# Patient Record
Sex: Male | Born: 1970 | Race: White | Hispanic: No | Marital: Married | Smoking: Never smoker
Health system: Southern US, Community
[De-identification: ages and names within clinical notes are randomized; demographics above are authoritative.]

## PROBLEM LIST (undated history)

## (undated) DIAGNOSIS — E7800 Pure hypercholesterolemia, unspecified: Secondary | ICD-10-CM

## (undated) DIAGNOSIS — E119 Type 2 diabetes mellitus without complications: Secondary | ICD-10-CM

## (undated) DIAGNOSIS — I10 Essential (primary) hypertension: Secondary | ICD-10-CM

---

## 2000-02-07 ENCOUNTER — Emergency Department (HOSPITAL_COMMUNITY): Admission: EM | Admit: 2000-02-07 | Discharge: 2000-02-07 | Payer: Self-pay

## 2001-03-17 ENCOUNTER — Other Ambulatory Visit: Admission: RE | Admit: 2001-03-17 | Discharge: 2001-03-17 | Payer: Self-pay | Admitting: Urology

## 2001-03-17 ENCOUNTER — Encounter (INDEPENDENT_AMBULATORY_CARE_PROVIDER_SITE_OTHER): Payer: Self-pay | Admitting: Specialist

## 2002-01-29 ENCOUNTER — Encounter: Payer: Self-pay | Admitting: Family Medicine

## 2002-01-29 ENCOUNTER — Encounter: Admission: RE | Admit: 2002-01-29 | Discharge: 2002-01-29 | Payer: Self-pay | Admitting: Family Medicine

## 2002-04-04 ENCOUNTER — Encounter: Payer: Self-pay | Admitting: Orthopedic Surgery

## 2002-04-04 ENCOUNTER — Ambulatory Visit (HOSPITAL_COMMUNITY): Admission: RE | Admit: 2002-04-04 | Discharge: 2002-04-04 | Payer: Self-pay | Admitting: Orthopedic Surgery

## 2006-06-12 ENCOUNTER — Emergency Department (HOSPITAL_COMMUNITY): Admission: EM | Admit: 2006-06-12 | Discharge: 2006-06-12 | Payer: Self-pay | Admitting: Emergency Medicine

## 2007-06-08 ENCOUNTER — Emergency Department (HOSPITAL_COMMUNITY): Admission: EM | Admit: 2007-06-08 | Discharge: 2007-06-08 | Payer: Self-pay | Admitting: Emergency Medicine

## 2011-02-26 NOTE — Consult Note (Signed)
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  The patient states that he has initially had most pain in the right shoulder  and workup and evaluation was focused on the right shoulder.  The patient  states he does have rotator cuff symptoms.  He states that, however,  recently he has been developing increasing pain in the left knee and has  pain with weightbearing on the left knee.  Initial injury was approximately  2 weeks ago.  The patient has been on ibuprofen, Skelaxin and Vicodin for  his shoulder symptoms.   PAST MEDICAL HISTORY:  Significant for hypertension.   SOCIAL HISTORY:  Does not use drugs.  Does not drink or smoke.  Works in  [employer].   Vital signs upon admission with temperature 99.4.  This was a maximum 101.6  after 1 hour of admission.  Blood pressure ranged from 167/104 to 134/80.  Pulse 86-106 with respiratory rate 20-22.  O2 saturation 95-100%.   On examination of the patient's left knee there was no effusion.  The  patella is nontender to palpation.  There is no tenderness to palpation over  the knee joint itself.  The patient has a large area of bruising with a  fluctuant fluid mass over the medial aspect of the left knee just superior  to the joint line in an area which measures about 10 x 10 cm of a large  subcutaneous fluid collection.  There  is no cellulitis.  There is bruising  and resolving hematoma but no evidence of cellulitis.  There was one area of  an abrasion in the middle of the fluid collection but no full-thickness skin  defect.   The patient received a tetanus booster upon admission and received  ceftriaxone.  Of note, he has a white cell count of 13.4 with a neutrophil  count of 74%.  The remainder of his labs were within normal limits.  Radiographs of the left knee shows an old ACL reconstruction with screws  proximally and distally.  There is no evidence of any gross effusion within  the knee and no bony abnormalities, no evidence of a fracture.   ASSESSMENT:  Hematoma versus seroma versus deep abscess superior aspect of  the left thigh medial to the joint.   PLAN:  After informed consent and sterile prepping, the patient underwent  local anesthetic with 15  mL of 1% lidocaine plain.  After adequate level of  anesthesia obtain an 18-gauge needle was inserted within the fluid  collection.  I was unable to aspirate any fluid.  Then a skin incision was  made approximately 2 cm in length.  Blunt dissection was carried down to the  fluid collection and a clear seroma was aspirated from this area.  There was  essentially no hemorrhagic fluid and no purulent fluid.  This was wicked  open with Iodoform gauze.  A sterile dressing was applied with 4x4s, Kerlix  and Coban.  Deep cultures were obtained.  These were sent for culture and  sensitivity.  We will go ahead and start him on doxycycline 100 mg p.o.  b.i.d. in the event that this may have an  early MRSA infection.  The patient will follow up in the office in 2 days.  He was also given a refill prescription for his Vicodin.  Discussed if he  had increasing pain or symptoms that he is to follow up immediately and  would then proceed with further irrigation and debridement.      Nadara Mustard, MD  Electronically Signed     MVD/MEDQ  D:  06/12/2006  T:   06/13/2006  Job:  [id number]

## 2011-07-23 LAB — URINALYSIS, ROUTINE W REFLEX MICROSCOPIC
Bilirubin Urine: NEGATIVE
Hgb urine dipstick: NEGATIVE
Ketones, ur: NEGATIVE
Protein, ur: NEGATIVE
Specific Gravity, Urine: 1.025
Urobilinogen, UA: 0.2

## 2011-07-23 LAB — I-STAT 8, (EC8 V) (CONVERTED LAB)
BUN: 14
Bicarbonate: 21
Chloride: 104
Glucose, Bld: 127 — ABNORMAL HIGH
HCT: 50
Hemoglobin: 17
Operator id: 294501
Potassium: 3.8
Sodium: 135
TCO2: 22
pCO2, Ven: 26.8 — ABNORMAL LOW
pH, Ven: 7.502 — ABNORMAL HIGH

## 2011-07-23 LAB — DIFFERENTIAL
Basophils Relative: 0
Lymphs Abs: 0.8
Monocytes Absolute: 1.2 — ABNORMAL HIGH
Monocytes Relative: 7
Neutro Abs: 15.4 — ABNORMAL HIGH
Neutrophils Relative %: 88 — ABNORMAL HIGH

## 2011-07-23 LAB — RAPID STREP SCREEN (MED CTR MEBANE ONLY): Streptococcus, Group A Screen (Direct): NEGATIVE

## 2011-07-23 LAB — CBC
HCT: 46
Hemoglobin: 15.9
MCHC: 34.5
MCV: 85.7
Platelets: 200
RBC: 5.36
RDW: 13.9
WBC: 17.4 — ABNORMAL HIGH

## 2011-07-23 LAB — URINE CULTURE
Colony Count: NO GROWTH
Culture: NO GROWTH

## 2011-07-23 LAB — POCT I-STAT CREATININE
Creatinine, Ser: 1
Operator id: 294501

## 2011-07-23 LAB — CULTURE, BLOOD (ROUTINE X 2)
Culture: NO GROWTH
Culture: NO GROWTH

## 2014-07-16 ENCOUNTER — Other Ambulatory Visit: Payer: Self-pay | Admitting: Gastroenterology

## 2014-07-16 DIAGNOSIS — R079 Chest pain, unspecified: Secondary | ICD-10-CM

## 2014-07-23 ENCOUNTER — Ambulatory Visit
Admission: RE | Admit: 2014-07-23 | Discharge: 2014-07-23 | Disposition: A | Discharge status: Home / Self Care | Payer: 59 | Source: Ambulatory Visit | Attending: Gastroenterology | Admitting: Gastroenterology

## 2014-07-23 DIAGNOSIS — R079 Chest pain, unspecified: Secondary | ICD-10-CM

## 2014-07-26 ENCOUNTER — Other Ambulatory Visit: Payer: Self-pay | Admitting: Family Medicine

## 2014-07-26 DIAGNOSIS — R1013 Epigastric pain: Secondary | ICD-10-CM

## 2016-04-19 ENCOUNTER — Ambulatory Visit: Payer: Self-pay

## 2016-04-19 ENCOUNTER — Other Ambulatory Visit: Payer: Self-pay | Admitting: Occupational Medicine

## 2016-04-19 DIAGNOSIS — M25562 Pain in left knee: Secondary | ICD-10-CM

## 2016-05-20 ENCOUNTER — Other Ambulatory Visit: Payer: Self-pay | Admitting: Sports Medicine

## 2016-05-20 DIAGNOSIS — M25562 Pain in left knee: Secondary | ICD-10-CM

## 2016-05-20 DIAGNOSIS — T1590XA Foreign body on external eye, part unspecified, unspecified eye, initial encounter: Secondary | ICD-10-CM

## 2016-05-24 ENCOUNTER — Ambulatory Visit
Admission: RE | Admit: 2016-05-24 | Discharge: 2016-05-24 | Disposition: A | Discharge status: Home / Self Care | Payer: Worker's Compensation | Source: Ambulatory Visit | Attending: Sports Medicine | Admitting: Sports Medicine

## 2016-05-24 DIAGNOSIS — T1590XA Foreign body on external eye, part unspecified, unspecified eye, initial encounter: Secondary | ICD-10-CM

## 2016-05-25 ENCOUNTER — Ambulatory Visit
Admission: RE | Admit: 2016-05-25 | Discharge: 2016-05-25 | Disposition: A | Discharge status: Home / Self Care | Payer: Worker's Compensation | Source: Ambulatory Visit | Attending: Sports Medicine | Admitting: Sports Medicine

## 2016-05-25 DIAGNOSIS — M25562 Pain in left knee: Secondary | ICD-10-CM

## 2017-05-05 ENCOUNTER — Emergency Department (HOSPITAL_COMMUNITY): Payer: 59

## 2017-05-05 ENCOUNTER — Encounter (HOSPITAL_COMMUNITY): Payer: Self-pay

## 2017-05-05 ENCOUNTER — Emergency Department (HOSPITAL_COMMUNITY)
Admission: EM | Admit: 2017-05-05 | Discharge: 2017-05-05 | Disposition: A | Discharge status: Home / Self Care | Payer: 59 | Attending: Emergency Medicine | Admitting: Emergency Medicine

## 2017-05-05 DIAGNOSIS — R42 Dizziness and giddiness: Secondary | ICD-10-CM | POA: Diagnosis not present

## 2017-05-05 DIAGNOSIS — Z7984 Long term (current) use of oral hypoglycemic drugs: Secondary | ICD-10-CM | POA: Diagnosis not present

## 2017-05-05 DIAGNOSIS — R739 Hyperglycemia, unspecified: Secondary | ICD-10-CM

## 2017-05-05 DIAGNOSIS — R1013 Epigastric pain: Secondary | ICD-10-CM | POA: Diagnosis not present

## 2017-05-05 DIAGNOSIS — E119 Type 2 diabetes mellitus without complications: Secondary | ICD-10-CM | POA: Diagnosis not present

## 2017-05-05 DIAGNOSIS — I10 Essential (primary) hypertension: Secondary | ICD-10-CM | POA: Diagnosis not present

## 2017-05-05 DIAGNOSIS — E860 Dehydration: Secondary | ICD-10-CM | POA: Diagnosis not present

## 2017-05-05 HISTORY — DX: Pure hypercholesterolemia, unspecified: E78.00

## 2017-05-05 HISTORY — DX: Type 2 diabetes mellitus without complications: E11.9

## 2017-05-05 HISTORY — DX: Essential (primary) hypertension: I10

## 2017-05-05 LAB — CBC
HCT: 45.6 % (ref 39.0–52.0)
Hemoglobin: 15.9 g/dL (ref 13.0–17.0)
MCH: 29.4 pg (ref 26.0–34.0)
MCHC: 34.9 g/dL (ref 30.0–36.0)
MCV: 84.3 fL (ref 78.0–100.0)
Platelets: 213 10*3/uL (ref 150–400)
RBC: 5.41 MIL/uL (ref 4.22–5.81)
RDW: 13.4 % (ref 11.5–15.5)
WBC: 6 10*3/uL (ref 4.0–10.5)

## 2017-05-05 LAB — BASIC METABOLIC PANEL
Anion gap: 9 (ref 5–15)
BUN: 11 mg/dL (ref 6–20)
CHLORIDE: 104 mmol/L (ref 101–111)
CO2: 23 mmol/L (ref 22–32)
CREATININE: 0.91 mg/dL (ref 0.61–1.24)
Calcium: 9.2 mg/dL (ref 8.9–10.3)
GFR calc Af Amer: >60 mL/min (ref >60)
GFR calc non Af Amer: >60 mL/min (ref >60)
Glucose, Bld: 269 mg/dL — ABNORMAL HIGH (ref 65–99)
Potassium: 3.9 mmol/L (ref 3.5–5.1)
SODIUM: 136 mmol/L (ref 135–145)

## 2017-05-05 LAB — URINALYSIS, ROUTINE W REFLEX MICROSCOPIC
Bacteria, UA: NONE SEEN
Bilirubin Urine: NEGATIVE
Hgb urine dipstick: NEGATIVE
Ketones, ur: NEGATIVE mg/dL
LEUKOCYTES UA: NEGATIVE
NITRITE: NEGATIVE
PH: 5 (ref 5.0–8.0)
PROTEIN: 30 mg/dL — AB
Specific Gravity, Urine: 1.025 (ref 1.005–1.030)

## 2017-05-05 LAB — LIPASE, BLOOD: LIPASE: 30 U/L (ref 11–51)

## 2017-05-05 LAB — TROPONIN I: Troponin I: <0.03 ng/mL (ref <0.03)

## 2017-05-05 MED ORDER — MECLIZINE HCL 25 MG PO TABS
25.0000 mg | ORAL_TABLET | Freq: Once | ORAL | Status: AC
  Administered 2017-05-05: 25 mg via ORAL
  Filled 2017-05-05: qty 1

## 2017-05-05 MED ORDER — MECLIZINE HCL 25 MG PO TABS: 25.0000 mg | ORAL_TABLET | Freq: Three times a day (TID) | ORAL | 0 refills | Status: DC | PRN

## 2017-05-05 MED ORDER — ONDANSETRON 4 MG PO TBDP: 4.0000 mg | ORAL_TABLET | Freq: Three times a day (TID) | ORAL | 0 refills | Status: DC | PRN

## 2017-05-05 MED ORDER — ONDANSETRON HCL 4 MG/2ML IJ SOLN
4.0000 mg | Freq: Once | INTRAMUSCULAR | Status: AC
  Administered 2017-05-05: 4 mg via INTRAVENOUS
  Filled 2017-05-05: qty 2

## 2017-05-05 MED ORDER — SODIUM CHLORIDE 0.9 % IV BOLUS (SEPSIS)
1000.0000 mL | Freq: Once | INTRAVENOUS | Status: AC
  Administered 2017-05-05: 1000 mL via INTRAVENOUS

## 2017-05-05 MED ORDER — FAMOTIDINE 20 MG PO TABS: 20.0000 mg | ORAL_TABLET | Freq: Two times a day (BID) | ORAL | 0 refills | Status: DC

## 2017-05-05 NOTE — ED Notes (Signed)
Patient transported to X-ray 

## 2017-05-05 NOTE — ED Provider Notes (Signed)
[facility]-EMERGENCY DEPT Provider Note   CSN: [id number] Arrival date & time: 05/05/17  [id number]     History   Chief Complaint Chief Complaint  Patient presents with  . Dizziness    HPI Russell Roach is a 46 y.o. male.  Pt presents to the ED today with dizziness.  He said sx started on Monday, July 23.  He did have a fever on the 21st, but none since.  He did see his pcp whom he saw on the 24th.  He said no meds were given.  He told the nurse he had cp, but denies current cp.  He did not take any of his meds this morning.  He describes the dizziness as worsening when he stands up and turns his head.  He feels unsteady when he walks and said things look like they are leaning to the left.  He did have some epigastric abd pain last night.      Past Medical History:  Diagnosis Date  . Diabetes mellitus without complication (HCC)   . High cholesterol   . Hypertension     There are no active problems to display for this patient.   History reviewed. No pertinent surgical history.     Home Medications    Prior to Admission medications   Medication Sig Start Date End Date Taking? Authorizing Provider  acetaminophen (TYLENOL) 325 MG tablet Take 650 mg by mouth every 6 (six) hours as needed for mild pain.   Yes [provider]  ibuprofen (ADVIL,MOTRIN) 800 MG tablet Take 800 mg by mouth every 8 (eight) hours as needed for moderate pain.   Yes [provider]  metFORMIN (GLUCOPHAGE) 1000 MG tablet Take 1,000 mg by mouth daily with breakfast.   Yes [provider]  olmesartan-hydrochlorothiazide (BENICAR HCT) 40-25 MG tablet Take 1 tablet by mouth daily.   Yes [provider]  famotidine (PEPCID) 20 MG tablet Take 1 tablet (20 mg total) by mouth 2 (two) times daily. 05/05/17   Jacalyn LefevreHaviland, Tremeka Helbling, MD  meclizine (ANTIVERT) 25 MG tablet Take 1 tablet (25 mg total) by mouth 3 (three) times daily as needed for dizziness. 05/05/17   Jacalyn LefevreHaviland, Ramanda Paules, MD    ondansetron (ZOFRAN ODT) 4 MG disintegrating tablet Take 1 tablet (4 mg total) by mouth every 8 (eight) hours as needed. 05/05/17   Jacalyn LefevreHaviland, Haniel Fix, MD    Family History History reviewed. No pertinent family history.  Social History Social History  Substance Use Topics  . Smoking status: Not on file  . Smokeless tobacco: Not on file  . Alcohol use Not on file     Allergies   Simvastatin   Review of Systems Review of Systems  Constitutional: Positive for fever.  Cardiovascular: Positive for chest pain.  Neurological: Positive for dizziness.  All other systems reviewed and are negative.    Physical Exam Updated Vital Signs BP 108/73   Pulse 70   Temp 97.8 F (36.6 C)   Resp (!) 22   SpO2 96%   Physical Exam  Constitutional: He is oriented to person, place, and time. He appears well-developed and well-nourished.  HENT:  Head: Normocephalic and atraumatic.  Right Ear: External ear normal.  Left Ear: External ear normal.  Nose: Nose normal.  Mouth/Throat: Oropharynx is clear and moist.  Eyes: Pupils are equal, round, and reactive to light. Conjunctivae and EOM are normal.  Neck: Normal range of motion. Neck supple.  Cardiovascular: Normal rate, regular rhythm, normal heart sounds and intact  distal pulses.   Pulmonary/Chest: Effort normal and breath sounds normal.  Abdominal: Soft. Bowel sounds are normal.  Musculoskeletal: Normal range of motion.  Neurological: He is alert and oriented to person, place, and time.  Skin: Skin is warm and dry.  Psychiatric: He has a normal mood and affect. His behavior is normal. Judgment and thought content normal.  Nursing note and vitals reviewed.    ED Treatments / Results  Labs (all labs ordered are listed, but only abnormal results are displayed) Labs Reviewed  BASIC METABOLIC PANEL - Abnormal; Notable for the following:       Result Value   Glucose, Bld 269 (*)    All other components within normal limits   URINALYSIS, ROUTINE W REFLEX MICROSCOPIC - Abnormal; Notable for the following:    Color, Urine AMBER (*)    Glucose, UA >=500 (*)    Protein, ur 30 (*)    Squamous Epithelial / LPF 0-5 (*)    All other components within normal limits  CBC  TROPONIN I  LIPASE, BLOOD  TROPONIN I  CBG MONITORING, ED    EKG  EKG Interpretation  Date/Time:  Thursday May 05 2017 06:22:54 EDT Ventricular Rate:  86 PR Interval:  142 QRS Duration: 90 QT Interval:  380 QTC Calculation: 454 R Axis:   6 Text Interpretation:  Normal sinus rhythm Anterior infarct , age undetermined Abnormal ECG No old tracing to compare Confirmed by Dione Booze ([id number]) on 05/05/2017 6:49:16 AM       Radiology Dg Chest 2 View  Result Date: 05/05/2017 CLINICAL DATA:  Mid to lower chest pain for the past 3 days. Some dizziness. EXAM: CHEST  2 VIEW COMPARISON:  Chest x-ray of June 08, 2007. FINDINGS: There is elevation of the right hemidiaphragm similar to that seen previously. There is no alveolar infiltrate or pleural effusion. The heart and pulmonary vascularity are normal. The mediastinum is normal in width. The bony thorax exhibits no acute abnormality. IMPRESSION: There is no active cardiopulmonary disease. Electronically Signed   By: David  [place] M.D.   On: 05/05/2017 07:11   Ct Head Wo Contrast  Result Date: 05/05/2017 CLINICAL DATA:  Complaints of dizziness for 3 days. EXAM: CT HEAD WITHOUT CONTRAST TECHNIQUE: Contiguous axial images were obtained from the base of the skull through the vertex without intravenous contrast. COMPARISON:  None. FINDINGS: Brain: No evidence of acute infarction, hemorrhage, hydrocephalus, extra-axial collection or mass lesion/mass effect. Normal cerebral volume. No white matter disease. Vascular: No hyperdense vessel or unexpected calcification. Skull: Normal. Negative for fracture or focal lesion. Sinuses/Orbits: No acute finding. Other: None. IMPRESSION: Negative exam. Electronically  Signed   By: Elsie Stain M.D.   On: 05/05/2017 08:14    Procedures Procedures (including critical care time)  Medications Ordered in ED Medications  sodium chloride 0.9 % bolus 1,000 mL (0 mLs Intravenous Stopped 05/05/17 1124)  ondansetron (ZOFRAN) injection 4 mg (4 mg Intravenous Given 05/05/17 0815)  meclizine (ANTIVERT) tablet 25 mg (25 mg Oral Given 05/05/17 4696)     Initial Impression / Assessment and Plan / ED Course  I have reviewed the triage vital signs and the nursing notes.  Pertinent labs & imaging results that were available during my care of the patient were reviewed by me and considered in my medical decision making (see chart for details).     Pt is feeling better.  He is able to ambulate.  I went over the home Epley maneuver with him and gave  him written instructions.  He knows to return if worse.  Final Clinical Impressions(s) / ED Diagnoses   Final diagnoses:  Vertigo  Dehydration  Hyperglycemia  Epigastric abdominal pain    New Prescriptions New Prescriptions   FAMOTIDINE (PEPCID) 20 MG TABLET    Take 1 tablet (20 mg total) by mouth 2 (two) times daily.   MECLIZINE (ANTIVERT) 25 MG TABLET    Take 1 tablet (25 mg total) by mouth 3 (three) times daily as needed for dizziness.   ONDANSETRON (ZOFRAN ODT) 4 MG DISINTEGRATING TABLET    Take 1 tablet (4 mg total) by mouth every 8 (eight) hours as needed.     Jacalyn LefevreHaviland, Dominyck Reser, MD 05/05/17 1126

## 2017-05-05 NOTE — ED Notes (Signed)
Pt returned to room from CT

## 2017-05-05 NOTE — ED Notes (Signed)
IV removed.

## 2017-05-05 NOTE — ED Notes (Signed)
Ambulated pt in hallway, tolerated well no complaints of dizziness.

## 2017-05-05 NOTE — ED Triage Notes (Signed)
Dizziness and substernal chest pain onset Monday, fever 101.5 weekend. Was seen at md on Tuesday and had labs drawn.

## 2017-05-05 NOTE — ED Notes (Signed)
Pt transported to CT ?

## 2017-08-10 IMAGING — CT CT HEAD W/O CM
3 of 4 series · 15 of 47 positions shown, 18 images · non-contrast
Comparison: None.

CLINICAL DATA: Complaints of dizziness for 3 days.

EXAM:
CT HEAD WITHOUT CONTRAST
TECHNIQUE: Contiguous axial images were obtained from the base of the skull
through the vertex without intravenous contrast.

[Series 4: head 2.0 h70h · axial · 0.46mm/px · z∈[-170,-42]mm · 9 of 82 slices shown, 12 images]
[im 9/82  brain]
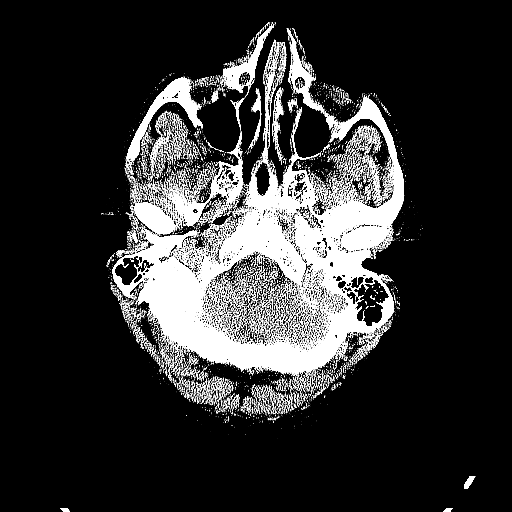
[im 9/82  bone]
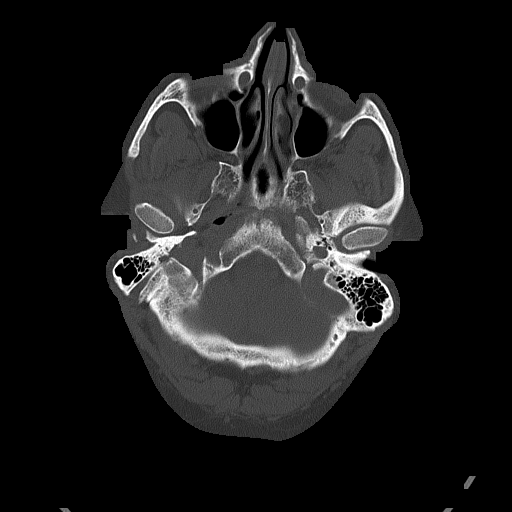
[im 17/82  brain]
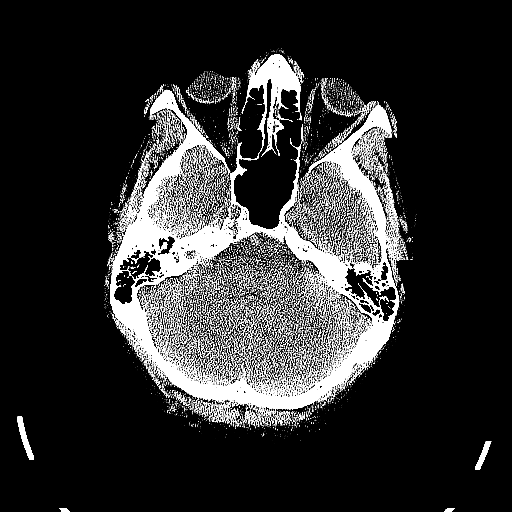
[im 25/82  brain]
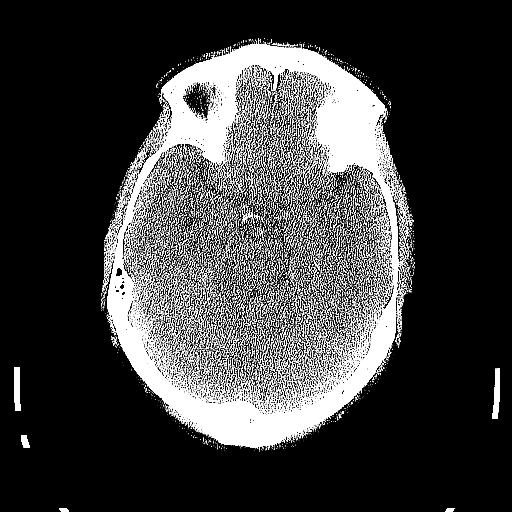
[im 33/82  brain]
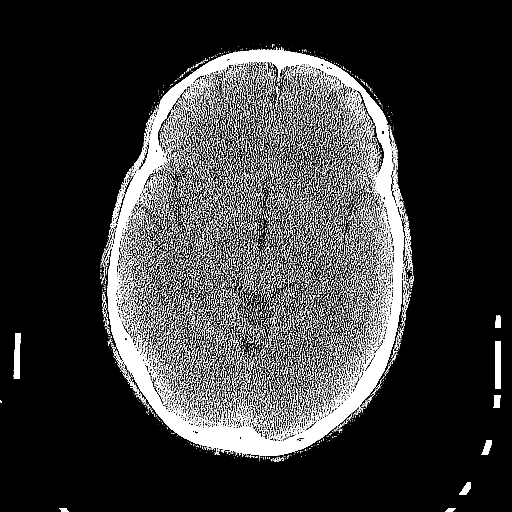
[im 41/82  brain]
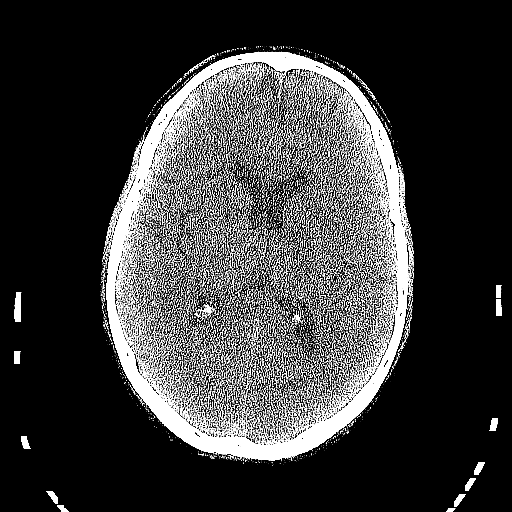
[im 41/82  bone]
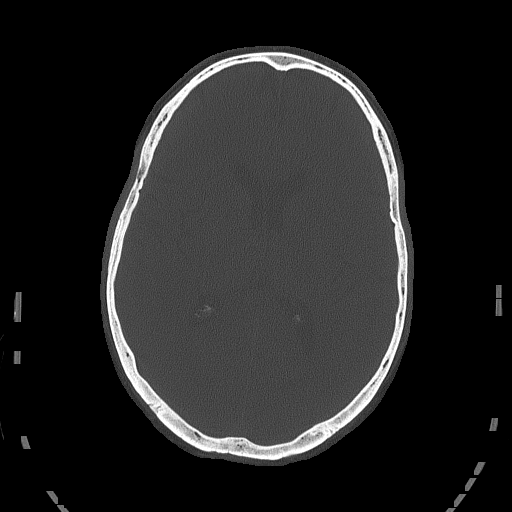
[im 49/82  brain]
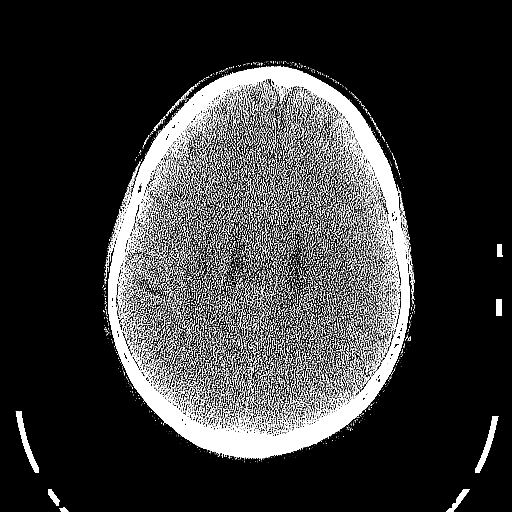
[im 57/82  brain]
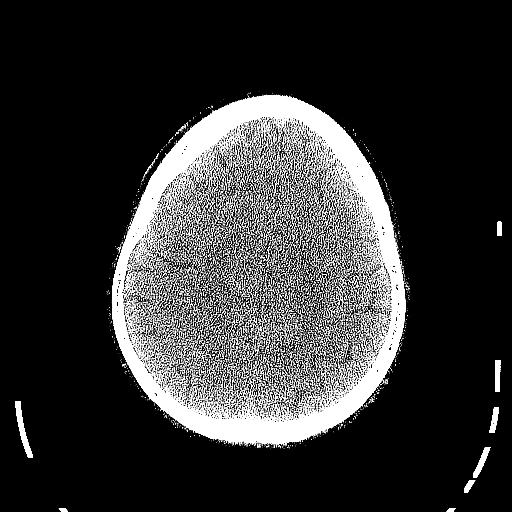
[im 65/82  brain]
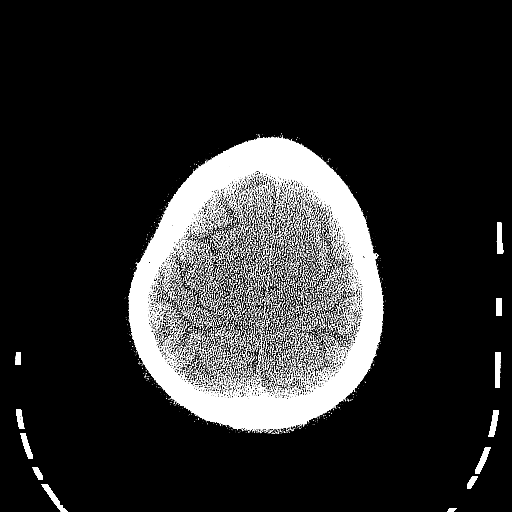
[im 73/82  brain]
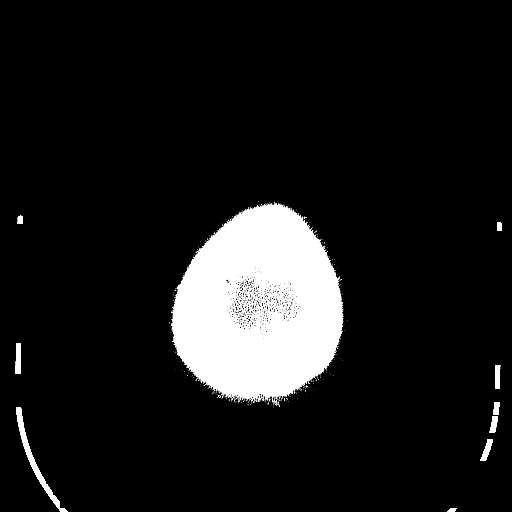
[im 73/82  bone]
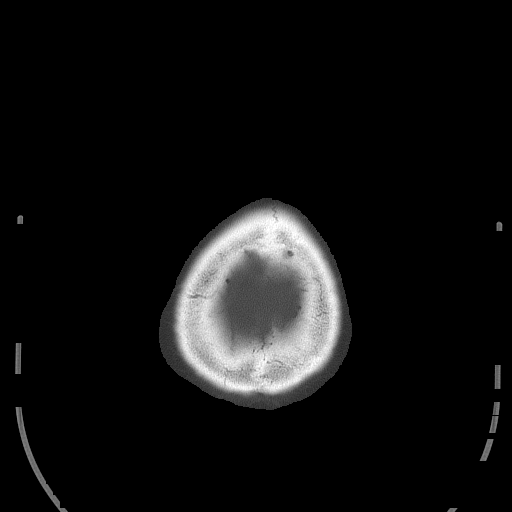

[Series 5: head 3.0 mpr cor · coronal · 0.32mm/px · 3 of 75 slices shown]
[im 29/75  brain]
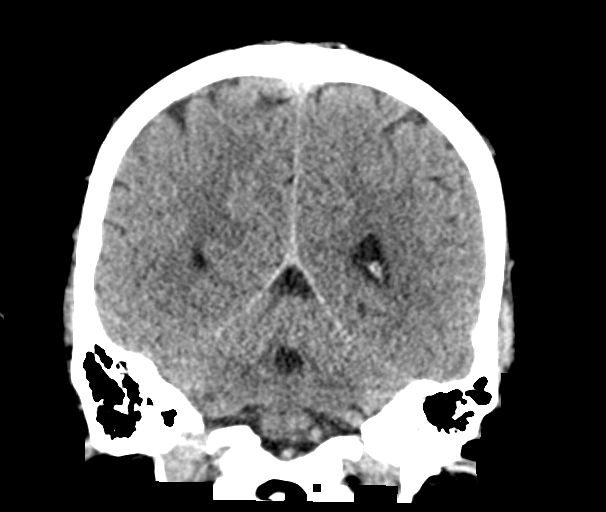
[im 35/75  brain]
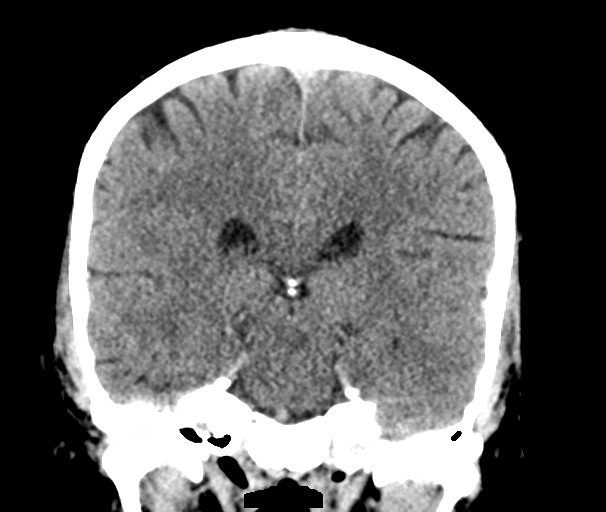
[im 41/75  brain]
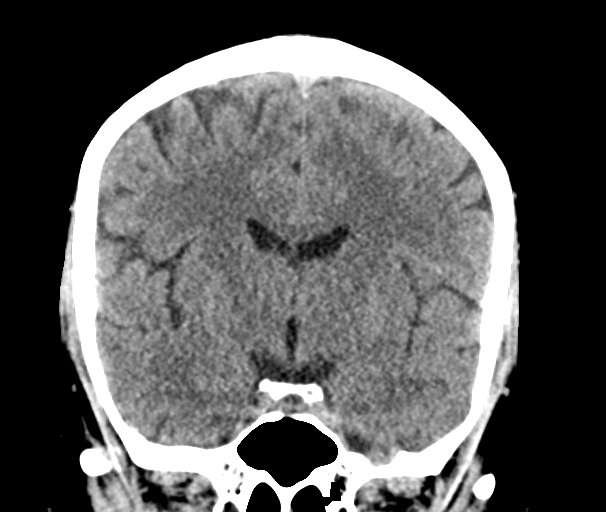

[Series 6: head 3.0 mpr sag · sagittal · 0.33mm/px · 3 of 66 slices shown]
[im 22/66  brain]
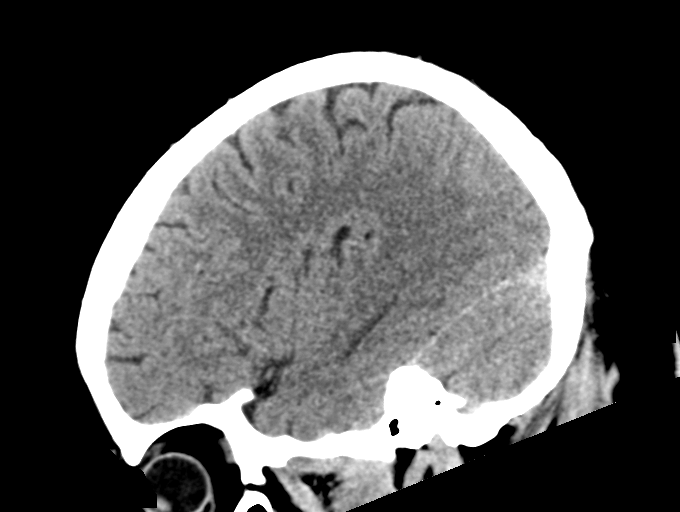
[im 33/66  brain]
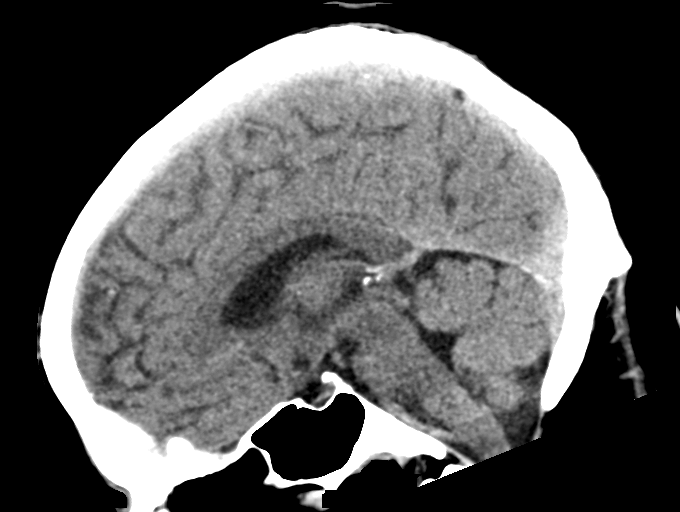
[im 44/66  brain]
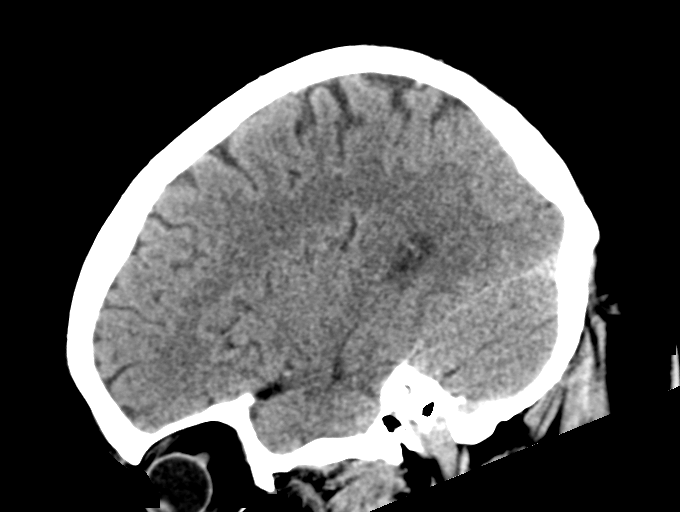

[15 of 47 positions shown; findings below may reference images not displayed]

FINDINGS: Brain: No evidence of acute infarction, hemorrhage, hydrocephalus,
extra-axial collection or mass lesion/mass effect. Normal cerebral
volume. No white matter disease.

Vascular: No hyperdense vessel or unexpected calcification.

Skull: Normal. Negative for fracture or focal lesion.

Sinuses/Orbits: No acute finding.

Other: None.
IMPRESSION: Negative exam.

## 2020-05-17 ENCOUNTER — Other Ambulatory Visit: Payer: Self-pay

## 2020-05-17 ENCOUNTER — Encounter (HOSPITAL_COMMUNITY): Payer: Self-pay | Admitting: Emergency Medicine

## 2020-05-17 ENCOUNTER — Emergency Department (HOSPITAL_COMMUNITY)
Admission: EM | Admit: 2020-05-17 | Discharge: 2020-05-17 | Disposition: A | Discharge status: Home / Self Care | Payer: 59 | Attending: Emergency Medicine | Admitting: Emergency Medicine

## 2020-05-17 DIAGNOSIS — W228XXA Striking against or struck by other objects, initial encounter: Secondary | ICD-10-CM | POA: Insufficient documentation

## 2020-05-17 DIAGNOSIS — Y9389 Activity, other specified: Secondary | ICD-10-CM | POA: Insufficient documentation

## 2020-05-17 DIAGNOSIS — E119 Type 2 diabetes mellitus without complications: Secondary | ICD-10-CM | POA: Insufficient documentation

## 2020-05-17 DIAGNOSIS — Y999 Unspecified external cause status: Secondary | ICD-10-CM | POA: Insufficient documentation

## 2020-05-17 DIAGNOSIS — I10 Essential (primary) hypertension: Secondary | ICD-10-CM | POA: Insufficient documentation

## 2020-05-17 DIAGNOSIS — Y9289 Other specified places as the place of occurrence of the external cause: Secondary | ICD-10-CM | POA: Insufficient documentation

## 2020-05-17 DIAGNOSIS — S0101XA Laceration without foreign body of scalp, initial encounter: Secondary | ICD-10-CM | POA: Insufficient documentation

## 2020-05-17 NOTE — Discharge Instructions (Signed)
Keep the wound clean and dry. There could be slight bleeding - if that occurs, apply firm, constant pressure for 10 minutes.  Please return to the ER if the headache gets severe and in not improving, you have associated new one sided numbness, tingling, weakness or confusion, seizures, poor balance or poor vision.

## 2020-05-17 NOTE — ED Triage Notes (Signed)
Patient here from home reporting head injury after standing up under metal garage door. Head lac noted to left top. Bleeding controlled. No blood thinners.

## 2020-05-17 NOTE — ED Provider Notes (Signed)
[facility]-EMERGENCY DEPT Provider Note   CSN: [id number] Arrival date & time: 05/17/20  1409     History Chief Complaint  Patient presents with  . Head Injury  . Head Laceration    Russell Roach is a 49 y.o. male.  HPI    49 y/o males with diabetes history comes in with cc of scalp bleeding. Pt reports that prior to ED arrival he struck his scalp on to carport. Pt started bleeding. Not on any blood thinners.  Pt has no associated nausea, vomiting, seizures, loss of consciousness or new visual complains, weakness, numbness, dizziness or gait instability.   Past Medical History:  Diagnosis Date  . Diabetes mellitus without complication (HCC)   . High cholesterol   . Hypertension     There are no problems to display for this patient.   History reviewed. No pertinent surgical history.     No family history on file.  Social History   Tobacco Use  . Smoking status: Never Smoker  . Smokeless tobacco: Never Used  Substance Use Topics  . Alcohol use: Never  . Drug use: Never    Home Medications Prior to Admission medications   Medication Sig Start Date End Date Taking? Authorizing Provider  acetaminophen (TYLENOL) 325 MG tablet Take 650 mg by mouth every 6 (six) hours as needed for mild pain.    [provider]  famotidine (PEPCID) 20 MG tablet Take 1 tablet (20 mg total) by mouth 2 (two) times daily. 05/05/17   Jacalyn Lefevre, MD  ibuprofen (ADVIL,MOTRIN) 800 MG tablet Take 800 mg by mouth every 8 (eight) hours as needed for moderate pain.    [provider]  meclizine (ANTIVERT) 25 MG tablet Take 1 tablet (25 mg total) by mouth 3 (three) times daily as needed for dizziness. 05/05/17   Jacalyn Lefevre, MD  metFORMIN (GLUCOPHAGE) 1000 MG tablet Take 1,000 mg by mouth daily with breakfast.    [provider]  olmesartan-hydrochlorothiazide (BENICAR HCT) 40-25 MG tablet Take 1 tablet by mouth daily.    [provider]  ondansetron (ZOFRAN ODT) 4 MG disintegrating tablet Take 1 tablet (4 mg total) by mouth every 8 (eight) hours as needed. 05/05/17   Jacalyn Lefevre, MD    Allergies    Simvastatin  Review of Systems   Review of Systems  Constitutional: Positive for activity change.  Hematological: Bruises/bleeds easily.    Physical Exam Updated Vital Signs BP (!) 152/107   Pulse 91   Temp 98.2 F (36.8 C) (Oral)   Resp 18   SpO2 99%   Physical Exam Vitals and nursing note reviewed.  Cardiovascular:     Rate and Rhythm: Normal rate.  Pulmonary:     Effort: Pulmonary effort is normal.  Musculoskeletal:     Comments: 6 cm laceration to the vertex  Neurological:     Mental Status: He is alert.     Cranial Nerves: No cranial nerve deficit.     ED Results / Procedures / Treatments   Labs (all labs ordered are listed, but only abnormal results are displayed) Labs Reviewed - No data to display  EKG None  Radiology No results found.  Procedures .Marland KitchenLaceration Repair  Date/Time: 05/17/2020 4:32 PM Performed by: Derwood Kaplan, MD Authorized by: Derwood Kaplan, MD   Consent:    Consent obtained:  Verbal   Consent given by:  Patient   Risks discussed:  Infection and pain   Alternatives discussed:  No  treatment Universal protocol:    Procedure explained and questions answered to patient or proxy's satisfaction: yes     Patient identity confirmed:  Arm band Laceration details:    Location:  Scalp   Scalp location:  Crown   Length (cm):  6   Depth (mm):  5 Repair type:    Repair type:  Intermediate Pre-procedure details:    Preparation:  Patient was prepped and draped in usual sterile fashion Exploration:    Wound exploration: wound explored through full range of motion     Contaminated: no   Treatment:    Area cleansed with:  Saline   Amount of cleaning:  Extensive   Irrigation solution:  Sterile saline   Irrigation volume:  20 Skin repair:    Repair  method:  Staples   Number of staples:  5 Approximation:    Approximation:  Loose Post-procedure details:    Dressing:  Open (no dressing)   Patient tolerance of procedure:  Tolerated well, no immediate complications   (including critical care time)  Medications Ordered in ED Medications - No data to display  ED Course  I have reviewed the triage vital signs and the nursing notes.  Pertinent labs & imaging results that were available during my care of the patient were reviewed by me and considered in my medical decision making (see chart for details).    MDM Rules/Calculators/A&P                          Pt comes in with cc of head trauma. Has a laceration that was repaired with 5 staples. No red flags for ICH - clearing brain clinically. Last tetanus was 2 years ago.  Final Clinical Impression(s) / ED Diagnoses Final diagnoses:  Laceration of scalp, initial encounter    Rx / DC Orders ED Discharge Orders    None       Derwood Kaplan, MD 05/17/20 [phone]

## 2021-02-10 ENCOUNTER — Observation Stay (HOSPITAL_COMMUNITY)
Admission: EM | Admit: 2021-02-10 | Discharge: 2021-02-11 | Disposition: A | Discharge status: Home / Self Care | Payer: Managed Care, Other (non HMO) | Attending: Internal Medicine | Admitting: Internal Medicine

## 2021-02-10 ENCOUNTER — Emergency Department (HOSPITAL_COMMUNITY): Payer: Managed Care, Other (non HMO)

## 2021-02-10 ENCOUNTER — Other Ambulatory Visit: Payer: Self-pay

## 2021-02-10 ENCOUNTER — Encounter (HOSPITAL_COMMUNITY): Payer: Self-pay | Admitting: Internal Medicine

## 2021-02-10 DIAGNOSIS — N179 Acute kidney failure, unspecified: Secondary | ICD-10-CM | POA: Diagnosis not present

## 2021-02-10 DIAGNOSIS — E1165 Type 2 diabetes mellitus with hyperglycemia: Secondary | ICD-10-CM | POA: Diagnosis present

## 2021-02-10 DIAGNOSIS — Z20822 Contact with and (suspected) exposure to covid-19: Secondary | ICD-10-CM | POA: Insufficient documentation

## 2021-02-10 DIAGNOSIS — E871 Hypo-osmolality and hyponatremia: Secondary | ICD-10-CM | POA: Diagnosis present

## 2021-02-10 DIAGNOSIS — Z7984 Long term (current) use of oral hypoglycemic drugs: Secondary | ICD-10-CM | POA: Insufficient documentation

## 2021-02-10 DIAGNOSIS — Z79899 Other long term (current) drug therapy: Secondary | ICD-10-CM | POA: Insufficient documentation

## 2021-02-10 DIAGNOSIS — R1084 Generalized abdominal pain: Secondary | ICD-10-CM | POA: Diagnosis present

## 2021-02-10 DIAGNOSIS — D751 Secondary polycythemia: Secondary | ICD-10-CM | POA: Diagnosis not present

## 2021-02-10 DIAGNOSIS — I10 Essential (primary) hypertension: Secondary | ICD-10-CM | POA: Diagnosis present

## 2021-02-10 DIAGNOSIS — R739 Hyperglycemia, unspecified: Secondary | ICD-10-CM

## 2021-02-10 DIAGNOSIS — E785 Hyperlipidemia, unspecified: Secondary | ICD-10-CM | POA: Diagnosis present

## 2021-02-10 DIAGNOSIS — E119 Type 2 diabetes mellitus without complications: Secondary | ICD-10-CM

## 2021-02-10 DIAGNOSIS — I700 Atherosclerosis of aorta: Secondary | ICD-10-CM

## 2021-02-10 HISTORY — DX: Type 2 diabetes mellitus without complications: E11.9

## 2021-02-10 LAB — URINALYSIS, ROUTINE W REFLEX MICROSCOPIC
Bacteria, UA: NONE SEEN
Bilirubin Urine: NEGATIVE
Glucose, UA: 150 mg/dL — AB
Hgb urine dipstick: NEGATIVE
Ketones, ur: NEGATIVE mg/dL
Leukocytes,Ua: NEGATIVE
Nitrite: NEGATIVE
Protein, ur: 30 mg/dL — AB
Specific Gravity, Urine: 1.027 (ref 1.005–1.030)
pH: 5 (ref 5.0–8.0)

## 2021-02-10 LAB — COMPREHENSIVE METABOLIC PANEL
ALT: 28 U/L (ref 0–44)
AST: 20 U/L (ref 15–41)
Albumin: 4.3 g/dL (ref 3.5–5.0)
Alkaline Phosphatase: 79 U/L (ref 38–126)
Anion gap: 13 (ref 5–15)
BUN: 36 mg/dL — ABNORMAL HIGH (ref 6–20)
CO2: 22 mmol/L (ref 22–32)
Calcium: 10 mg/dL (ref 8.9–10.3)
Chloride: 97 mmol/L — ABNORMAL LOW (ref 98–111)
Creatinine, Ser: 2.36 mg/dL — ABNORMAL HIGH (ref 0.61–1.24)
GFR, Estimated: 32 mL/min — ABNORMAL LOW (ref >60)
Glucose, Bld: 262 mg/dL — ABNORMAL HIGH (ref 70–99)
Potassium: 4.6 mmol/L (ref 3.5–5.1)
Sodium: 132 mmol/L — ABNORMAL LOW (ref 135–145)
Total Bilirubin: 0.8 mg/dL (ref 0.3–1.2)
Total Protein: 7.2 g/dL (ref 6.5–8.1)

## 2021-02-10 LAB — CBC WITH DIFFERENTIAL/PLATELET
Abs Immature Granulocytes: 0.03 10*3/uL (ref 0.00–0.07)
Basophils Absolute: 0 10*3/uL (ref 0.0–0.1)
Basophils Relative: 1 %
Eosinophils Absolute: 0 10*3/uL (ref 0.0–0.5)
Eosinophils Relative: 0 %
HCT: 52.5 % — ABNORMAL HIGH (ref 39.0–52.0)
Hemoglobin: 18.5 g/dL — ABNORMAL HIGH (ref 13.0–17.0)
Immature Granulocytes: 0 %
Lymphocytes Relative: 32 %
Lymphs Abs: 2.9 10*3/uL (ref 0.7–4.0)
MCH: 30 pg (ref 26.0–34.0)
MCHC: 35.2 g/dL (ref 30.0–36.0)
MCV: 85.1 fL (ref 80.0–100.0)
Monocytes Absolute: 0.7 10*3/uL (ref 0.1–1.0)
Monocytes Relative: 8 %
Neutro Abs: 5.2 10*3/uL (ref 1.7–7.7)
Neutrophils Relative %: 59 %
Platelets: 265 10*3/uL (ref 150–400)
RBC: 6.17 MIL/uL — ABNORMAL HIGH (ref 4.22–5.81)
RDW: 12.2 % (ref 11.5–15.5)
WBC: 8.8 10*3/uL (ref 4.0–10.5)
nRBC: 0 % (ref 0.0–0.2)

## 2021-02-10 LAB — RESP PANEL BY RT-PCR (FLU A&B, COVID) ARPGX2
Influenza A by PCR: NEGATIVE
Influenza B by PCR: NEGATIVE
SARS Coronavirus 2 by RT PCR: NEGATIVE

## 2021-02-10 LAB — CBG MONITORING, ED: Glucose-Capillary: 269 mg/dL — ABNORMAL HIGH (ref 70–99)

## 2021-02-10 LAB — LIPASE, BLOOD: Lipase: 46 U/L (ref 11–51)

## 2021-02-10 MED ORDER — ENOXAPARIN SODIUM 30 MG/0.3ML IJ SOSY
30.0000 mg | PREFILLED_SYRINGE | INTRAMUSCULAR | Status: DC
  Administered 2021-02-11: 30 mg via SUBCUTANEOUS
  Filled 2021-02-10: qty 0.3

## 2021-02-10 MED ORDER — ONDANSETRON HCL 4 MG PO TABS: 4.0000 mg | ORAL_TABLET | Freq: Four times a day (QID) | ORAL | Status: DC | PRN

## 2021-02-10 MED ORDER — ONDANSETRON HCL 4 MG/2ML IJ SOLN
4.0000 mg | Freq: Once | INTRAMUSCULAR | Status: AC
  Administered 2021-02-10: 4 mg via INTRAVENOUS
  Filled 2021-02-10: qty 2

## 2021-02-10 MED ORDER — LACTATED RINGERS IV BOLUS
1000.0000 mL | Freq: Once | INTRAVENOUS | Status: AC
  Administered 2021-02-10: 1000 mL via INTRAVENOUS

## 2021-02-10 MED ORDER — SODIUM CHLORIDE 0.9 % IV BOLUS
1000.0000 mL | Freq: Once | INTRAVENOUS | Status: AC
  Administered 2021-02-10: 1000 mL via INTRAVENOUS

## 2021-02-10 MED ORDER — SODIUM CHLORIDE 0.9 % IV SOLN: Freq: Once | INTRAVENOUS | Status: AC

## 2021-02-10 MED ORDER — ONDANSETRON HCL 4 MG/2ML IJ SOLN: 4.0000 mg | Freq: Four times a day (QID) | INTRAMUSCULAR | Status: DC | PRN

## 2021-02-10 MED ORDER — LACTATED RINGERS IV SOLN: INTRAVENOUS | Status: DC

## 2021-02-10 MED ORDER — ACETAMINOPHEN 325 MG PO TABS: 650.0000 mg | ORAL_TABLET | Freq: Four times a day (QID) | ORAL | Status: DC | PRN

## 2021-02-10 MED ORDER — ACETAMINOPHEN 650 MG RE SUPP: 650.0000 mg | Freq: Four times a day (QID) | RECTAL | Status: DC | PRN

## 2021-02-10 NOTE — ED Triage Notes (Addendum)
Pt reports feeling "unwell" since last Thursday. Pt states been having decrease appetite, abd pain N/V/D and seen his PCP yesterday where they reports pt CBG and kidney function abnormal

## 2021-02-10 NOTE — H&P (Signed)
History and Physical    Russell Roach BWI:[id number] DOB: 1971-07-02 DOA: 02/10/2021  PCP: Rick Duff, PA-C   Patient coming from: Home.   I have personally briefly reviewed patient's old medical records in [facility]  Chief Complaint: Abnormal labs.  HPI: Russell Roach is a 50 y.o. male with medical history significant of hyperlipidemia, hypertension, type 2 diabetes who is coming to the emergency department due to not feeling well since 5 days ago due to decreased appetite, abdominal pain, an episode of diarrhea on Thursday and an episode of nausea/emesis on Monday after he first injected Ozempic on Thursday.  Denies melena or hematochezia.  No dysuria, frequency or materia.  He denies fever, chills, sore throat, rhinorrhea, chest pain, palpitations, diaphoresis, PND, orthopnea or pitting edema of the lower extremities.  No polyuria, polydipsia, polyphagia or blurred vision.  He was seen the previous day by his PCP who told him that his kidney function was abnormal.  ED Course: Initial vital signs were temperature 98.1 F, pulse 102, respiration 16, BP 105/74 mmHg O2 sat 97% on room air.  The patient received ondansetron 4 mg IVP x1, a 1000 mL NS bolus and 1000 mL LR bolus.  Lab work: His urinalysis was cloudy with glucosuria 150 and proteinuria 30 mg/dL.  CBC shows a white count of 8.8, hemoglobin 18.5 g/dL and platelets 638.  Lipase was normal.  CMP showed a sodium 132 and chloride of 97 mmol/L.  The rest of the electrolytes are within normal limits.  Glucose 262, BUN 36 and creatinine 2.36 mg/dL.  Hepatic functions were normal.  Imaging: CT abdomen/pelvis without contrast did not show any acute pathology.  However there was aortic atherosclerosis.  Please see images and full radiology report for further detail.  Review of Systems: As per HPI otherwise all other systems reviewed and are negative.  Past Medical History:  Diagnosis Date  . High cholesterol   . Hypertension    . Type 2 diabetes mellitus (HCC) 02/10/2021    History reviewed. No pertinent surgical history.  Social History  reports that he has never smoked. He has never used smokeless tobacco. He reports that he does not drink alcohol and does not use drugs.  Allergies  Allergen Reactions  . Simvastatin Other (See Comments)    Muscle aches.   Medical family history Both parents and grandparents with type II DM. Maternal grandfather history of CAD.  Prior to Admission medications   Medication Sig Start Date End Date Taking? Authorizing Provider  acetaminophen (TYLENOL) 325 MG tablet Take 650 mg by mouth every 6 (six) hours as needed for mild pain.    [provider]  famotidine (PEPCID) 20 MG tablet Take 1 tablet (20 mg total) by mouth 2 (two) times daily. 05/05/17   Jacalyn Lefevre, MD  ibuprofen (ADVIL,MOTRIN) 800 MG tablet Take 800 mg by mouth every 8 (eight) hours as needed for moderate pain.    [provider]  meclizine (ANTIVERT) 25 MG tablet Take 1 tablet (25 mg total) by mouth 3 (three) times daily as needed for dizziness. 05/05/17   Jacalyn Lefevre, MD  metFORMIN (GLUCOPHAGE) 1000 MG tablet Take 1,000 mg by mouth daily with breakfast.    [provider]  olmesartan-hydrochlorothiazide (BENICAR HCT) 40-25 MG tablet Take 1 tablet by mouth daily.    [provider]  ondansetron (ZOFRAN ODT) 4 MG disintegrating tablet Take 1 tablet (4 mg total) by mouth every 8 (eight) hours as needed. 05/05/17  Jacalyn Lefevre, MD   Physical Exam: Vitals:   02/10/21 1832 02/10/21 2115 02/10/21 2130 02/10/21 2200  BP: 111/79 119/83 118/83 116/84  Pulse: 90 77 80 79  Resp: 16 (!) 24 (!) 23 (!) 23  Temp:      TempSrc:      SpO2: 97% 94% 96% 93%  Weight:      Height:       Constitutional: NAD, calm, comfortable Eyes: PERRL, sclera, lids and conjunctivae mildly injected. ENMT: Mucous membranes are mildly dry.  Posterior pharynx clear of any exudate or lesions. Neck:  normal, supple, no masses, no thyromegaly Respiratory: clear to auscultation bilaterally, no wheezing, no crackles. Normal respiratory effort. No accessory muscle use.  Cardiovascular: Regular rate and rhythm, no murmurs / rubs / gallops. No extremity edema. 2+ pedal pulses. No carotid bruits.  Abdomen: No distention.  Bowel sounds positive.  Soft, mild epigastric tenderness, no guarding or rebound.  No masses palpated. No hepatosplenomegaly.  Believe Musculoskeletal: Mild generalized weakness.  No clubbing / cyanosis.  Good ROM, no contractures. Normal muscle tone.  Skin: no acute rashes, lesions, ulcers on very limited otological semination. Neurologic: CN 2-12 grossly intact. Sensation intact, DTR normal. Strength 5/5 in all 4.  Psychiatric: Normal judgment and insight. Alert and oriented x 3. Normal mood.   Labs on Admission: I have personally reviewed following labs and imaging studies  CBC: Recent Labs  Lab 02/10/21 1637  WBC 8.8  NEUTROABS 5.2  HGB 18.5*  HCT 52.5*  MCV 85.1  PLT 265    Basic Metabolic Panel: Recent Labs  Lab 02/10/21 1637  NA 132*  K 4.6  CL 97*  CO2 22  GLUCOSE 262*  BUN 36*  CREATININE 2.36*  CALCIUM 10.0    GFR: Estimated Creatinine Clearance: 39.8 mL/min (A) (by C-G formula based on SCr of 2.36 mg/dL (H)).  Liver Function Tests: Recent Labs  Lab 02/10/21 1637  AST 20  ALT 28  ALKPHOS 79  BILITOT 0.8  PROT 7.2  ALBUMIN 4.3    Urine analysis:    Component Value Date/Time   COLORURINE AMBER (A) 02/10/2021 2141   APPEARANCEUR CLOUDY (A) 02/10/2021 2141   LABSPEC 1.027 02/10/2021 2141   PHURINE 5.0 02/10/2021 2141   GLUCOSEU 150 (A) 02/10/2021 2141   HGBUR NEGATIVE 02/10/2021 2141   BILIRUBINUR NEGATIVE 02/10/2021 2141   KETONESUR NEGATIVE 02/10/2021 2141   PROTEINUR 30 (A) 02/10/2021 2141   UROBILINOGEN 0.2 06/08/2007 1843   NITRITE NEGATIVE 02/10/2021 2141   LEUKOCYTESUR NEGATIVE 02/10/2021 2141    Radiological Exams on  Admission: CT ABDOMEN PELVIS WO CONTRAST  Result Date: 02/10/2021 CLINICAL DATA:  Abdominal pain, diarrhea, nausea and vomiting for 1 week EXAM: CT ABDOMEN AND PELVIS WITHOUT CONTRAST TECHNIQUE: Multidetector CT imaging of the abdomen and pelvis was performed following the standard protocol without IV contrast. Unenhanced CT was performed per clinician order. Lack of IV contrast limits sensitivity and specificity, especially for evaluation of abdominal/pelvic solid viscera. COMPARISON:  None. FINDINGS: Lower chest: No acute pleural or parenchymal lung disease. Hepatobiliary: No focal liver abnormality is seen. No gallstones, gallbladder wall thickening, or biliary dilatation. Pancreas: Unremarkable. No pancreatic ductal dilatation or surrounding inflammatory changes. Spleen: Normal in size without focal abnormality. Adrenals/Urinary Tract: No urinary tract calculi or obstructive uropathy. The adrenals and bladder are unremarkable. Stomach/Bowel: No bowel obstruction or ileus. Normal appendix right lower quadrant. No bowel wall thickening or inflammatory change. Vascular/Lymphatic: Aortic atherosclerosis. No enlarged abdominal or pelvic lymph nodes. Reproductive:  Prostate is unremarkable. Other: No free fluid or free intraperitoneal gas. No abdominal wall hernia. Musculoskeletal: No acute or destructive bony lesions. Reconstructed images demonstrate no additional findings. IMPRESSION: 1. Unremarkable unenhanced CT of the abdomen and pelvis. 2.  Aortic Atherosclerosis (ICD10-I70.0). Electronically Signed   By: Sharlet Salina M.D.   On: 02/10/2021 22:47    EKG: Independently reviewed.   Assessment/Plan Principal Problem:   AKI (acute kidney injury) (HCC) In the setting of dehydration due to gastroenteritis Observation/MedSurg. Continue IV fluids. Hold Benicar. Monitor intake and output.  Active Problems:   Polycythemia Due to dehydration. Continue IV fluids for Follow-up H&H.    Hyponatremia Due  to GI losses. Continue IV fluids for Follow-up sodium level.    Hypertension Hold ARB and diuretic. Monitor blood pressure. As needed antihypertensive. Monitor renal function electrolytes.    Hyperlipidemia On WelChol 3 times daily.    Type 2 diabetes mellitus with hyperglycemia (HCC) Carbohydrate modified diet. CBG monitoring with RI SS. Hold metformin until creatinine normalizes.   DVT prophylaxis: SCDs. Code Status:   Full code. Family Communication:   Disposition Plan:   Patient is from:  Home.  Anticipated DC to:  Home.  Anticipated DC date:  02/12/2021.  Anticipated DC barriers: Clinical status.  Consults called:   Admission status:  Observation/MedSurg.   Severity of Illness: High severity due to acute kidney injury in the setting of acute gastroenteritis.  The patient will need to remain for IV hydration.  Bobette Mo MD [facility]  How to contact the [facility] Attending or Consulting provider 7A - 7P or covering provider during after hours 7P -7A, for this patient?   1. Check the care team in [facility] and look for a) attending/consulting [facility] provider listed and b) the [facility] team listed 2. Log into [email or web address] and use [facility]'s universal password to access. If you do not have the password, please contact the hospital operator. 3. Locate the [facility] provider you are looking for under [facility] and page to a number that you can be directly reached. 4. If you still have difficulty reaching the provider, please page the [facility] (Director on Call) for the Hospitalists listed on amion for assistance.  02/10/2021, 11:08 PM   This document was prepared using Dragon voice recognition software and may contain some unintended transcription errors.

## 2021-02-10 NOTE — ED Provider Notes (Addendum)
[facility] EMERGENCY DEPARTMENT Provider Note   CSN: [id number] Arrival date & time: 02/10/21  1519     History Chief Complaint  Patient presents with  . Abdominal Pain    Russell Roach is a 50 y.o. male.  The history is provided by the patient.  Abdominal Pain Pain location:  Generalized Pain quality: aching   Pain radiates to:  Does not radiate Pain severity:  Mild Onset quality:  Gradual Timing:  Intermittent Progression:  Waxing and waning Chronicity:  New Context comment:  Pain and decreaed PO after starting ozempic last week. Patient with dehydration and sent by PCP. Relieved by:  Nothing Worsened by:  Nothing Associated symptoms: diarrhea, nausea and vomiting   Associated symptoms: no anorexia, no belching, no chest pain, no chills, no constipation, no cough, no dysuria, no fever, no hematuria, no melena, no shortness of breath and no sore throat        Past Medical History:  Diagnosis Date  . Diabetes mellitus without complication (HCC)   . High cholesterol   . Hypertension     There are no problems to display for this patient.   No past surgical history on file.     No family history on file.  Social History   Tobacco Use  . Smoking status: Never Smoker  . Smokeless tobacco: Never Used  Substance Use Topics  . Alcohol use: Never  . Drug use: Never    Home Medications Prior to Admission medications   Medication Sig Start Date End Date Taking? Authorizing Provider  acetaminophen (TYLENOL) 325 MG tablet Take 650 mg by mouth every 6 (six) hours as needed for mild pain.    [provider]  famotidine (PEPCID) 20 MG tablet Take 1 tablet (20 mg total) by mouth 2 (two) times daily. 05/05/17   Jacalyn Lefevre, MD  ibuprofen (ADVIL,MOTRIN) 800 MG tablet Take 800 mg by mouth every 8 (eight) hours as needed for moderate pain.    [provider]  meclizine (ANTIVERT) 25 MG tablet Take 1 tablet (25 mg total) by mouth 3  (three) times daily as needed for dizziness. 05/05/17   Jacalyn Lefevre, MD  metFORMIN (GLUCOPHAGE) 1000 MG tablet Take 1,000 mg by mouth daily with breakfast.    [provider]  olmesartan-hydrochlorothiazide (BENICAR HCT) 40-25 MG tablet Take 1 tablet by mouth daily.    [provider]  ondansetron (ZOFRAN ODT) 4 MG disintegrating tablet Take 1 tablet (4 mg total) by mouth every 8 (eight) hours as needed. 05/05/17   Jacalyn Lefevre, MD    Allergies    Simvastatin  Review of Systems   Review of Systems  Constitutional: Negative for chills and fever.  HENT: Negative for ear pain and sore throat.   Eyes: Negative for pain and visual disturbance.  Respiratory: Negative for cough and shortness of breath.   Cardiovascular: Negative for chest pain and palpitations.  Gastrointestinal: Positive for abdominal pain, diarrhea, nausea and vomiting. Negative for anorexia, constipation and melena.  Genitourinary: Negative for dysuria and hematuria.  Musculoskeletal: Negative for arthralgias and back pain.  Skin: Negative for color change and rash.  Neurological: Negative for seizures and syncope.  All other systems reviewed and are negative.   Physical Exam Updated Vital Signs  ED Triage Vitals  Enc Vitals Group     BP 02/10/21 1557 105/74     Pulse Rate 02/10/21 1557 (!) 102     Resp 02/10/21 1557 16  Temp 02/10/21 1557 98.1 F (36.7 C)     Temp Source 02/10/21 1557 Oral     SpO2 02/10/21 1557 97 %     Weight 02/10/21 1625 197 lb (89.4 kg)     Height 02/10/21 1625 5\' 8"  (1.727 m)     Head Circumference --      Peak Flow --      Pain Score 02/10/21 1641 8     Pain Loc --      Pain Edu? --      Excl. in GC? --     Physical Exam Vitals and nursing note reviewed.  Constitutional:      General: He is not in acute distress.    Appearance: He is well-developed. He is not ill-appearing.  HENT:     Head: Normocephalic and atraumatic.     Mouth/Throat:     Mouth:  Mucous membranes are moist.  Eyes:     Extraocular Movements: Extraocular movements intact.     Conjunctiva/sclera: Conjunctivae normal.  Cardiovascular:     Rate and Rhythm: Normal rate and regular rhythm.     Heart sounds: Normal heart sounds. No murmur heard.   Pulmonary:     Effort: Pulmonary effort is normal. No respiratory distress.     Breath sounds: Normal breath sounds.  Abdominal:     General: Abdomen is flat.     Palpations: Abdomen is soft.     Tenderness: There is no abdominal tenderness. There is no right CVA tenderness, left CVA tenderness, guarding or rebound.     Hernia: No hernia is present.  Musculoskeletal:     Cervical back: Neck supple.  Skin:    General: Skin is warm and dry.     Capillary Refill: Capillary refill takes less than 2 seconds.  Neurological:     General: No focal deficit present.     Mental Status: He is alert.     ED Results / Procedures / Treatments   Labs (all labs ordered are listed, but only abnormal results are displayed) Labs Reviewed  COMPREHENSIVE METABOLIC PANEL - Abnormal; Notable for the following components:      Result Value   Sodium 132 (*)    Chloride 97 (*)    Glucose, Bld 262 (*)    BUN 36 (*)    Creatinine, Ser 2.36 (*)    GFR, Estimated 32 (*)    All other components within normal limits  CBC WITH DIFFERENTIAL/PLATELET - Abnormal; Notable for the following components:   RBC 6.17 (*)    Hemoglobin 18.5 (*)    HCT 52.5 (*)    All other components within normal limits  URINALYSIS, ROUTINE W REFLEX MICROSCOPIC - Abnormal; Notable for the following components:   Color, Urine AMBER (*)    APPearance CLOUDY (*)    Glucose, UA 150 (*)    Protein, ur 30 (*)    All other components within normal limits  CBG MONITORING, ED - Abnormal; Notable for the following components:   Glucose-Capillary 269 (*)    All other components within normal limits  RESP PANEL BY RT-PCR (FLU A&B, COVID) ARPGX2  LIPASE, BLOOD     EKG None  Radiology No results found.  Procedures Procedures   Medications Ordered in ED Medications  0.9 %  sodium chloride infusion (has no administration in time range)  sodium chloride 0.9 % bolus 1,000 mL (1,000 mLs Intravenous New Bag/Given 02/10/21 2127)  ondansetron (ZOFRAN) injection 4 mg (4 mg Intravenous Given  02/10/21 2127)    ED Course  I have reviewed the triage vital signs and the nursing notes.  Pertinent labs & imaging results that were available during my care of the patient were reviewed by me and considered in my medical decision making (see chart for details).    MDM Rules/Calculators/A&P                          CALIXTO PAVEL is here with dehydration, abdominal pain, decreased appetite.  History of diabetes.  Has felt abdominal cramping the last several days.  Symptoms started after he started Ozempic for his diabetes.  He has had decreased urinary output.  Patient saw primary care doctor yesterday and kidney function was found to be elevated and was sent for work-up today.  Blood sugars elevated today and creatinine is elevated 2.6.  Blood sugars 262 but patient is not in DKA.  Urinalysis negative for infection.  Overall suspect dehydration from decreased p.o. intake and may be medication side effect.  No real focal abdominal tenderness on exam but will get a CT scan to evaluate for any obstructive uropathy.  May also need renal ultrasound.  Will give fluid bolus and start IV maintenance fluids.  This could also be a component of gastroparesis as well.  CT scan unremarkable.  Will admit for further hydration.  This chart was dictated using voice recognition software.  Despite best efforts to proofread,  errors can occur which can change the documentation meaning.    Final Clinical Impression(s) / ED Diagnoses Final diagnoses:  AKI (acute kidney injury) (HCC)  Hyperglycemia    Rx / DC Orders ED Discharge Orders    None       Virgina Norfolk,  DO 02/10/21 2207    Virgina Norfolk, DO 02/10/21 2254

## 2021-02-10 NOTE — ED Provider Notes (Signed)
Emergency Medicine Provider Triage Evaluation Note  Russell Roach , a 50 y.o. male  was evaluated in triage.  Pt complains of abdominal pain and diarrhea and N/V for about one week.  His abdominal pain is all over.  He states that his primary care doctor, whom he saw yesterday, did lab work and told him that his sugar was up and his kidney function was off.  He does not know the exact numbers. He denies any known sick contacts.  Review of Systems  Positive: abd pain, N/V/D.  Negative: fever  Physical Exam  BP 105/74 (BP Location: Left Arm)   Pulse (!) 102   Temp 98.1 F (36.7 C) (Oral)   Resp 16   Ht 5\' 8"  (1.727 m)   Wt 89.4 kg   SpO2 97%   BMI 29.95 kg/m  Gen:   Awake, no distress   Resp:  Normal effort  MSK:   Moves extremities without difficulty  Other:  Abd is generally TTP.   Medical Decision Making  Medically screening exam initiated at 4:34 PM.  Appropriate orders placed.  Russell Roach was informed that the remainder of the evaluation will be completed by another provider, this initial triage assessment does not replace that evaluation, and the importance of remaining in the ED until their evaluation is complete.    Shirlee Limerick, PA-C 02/10/21 1638    04/12/21, MD 02/11/21 [phone]

## 2021-02-10 NOTE — ED Notes (Signed)
Received verbal report from Isaac Bliss at this time.

## 2021-02-11 DIAGNOSIS — E1165 Type 2 diabetes mellitus with hyperglycemia: Secondary | ICD-10-CM

## 2021-02-11 DIAGNOSIS — I700 Atherosclerosis of aorta: Secondary | ICD-10-CM | POA: Diagnosis not present

## 2021-02-11 DIAGNOSIS — E785 Hyperlipidemia, unspecified: Secondary | ICD-10-CM | POA: Diagnosis not present

## 2021-02-11 DIAGNOSIS — D751 Secondary polycythemia: Secondary | ICD-10-CM

## 2021-02-11 DIAGNOSIS — I10 Essential (primary) hypertension: Secondary | ICD-10-CM

## 2021-02-11 DIAGNOSIS — E871 Hypo-osmolality and hyponatremia: Secondary | ICD-10-CM

## 2021-02-11 DIAGNOSIS — N179 Acute kidney failure, unspecified: Secondary | ICD-10-CM

## 2021-02-11 LAB — CBC
HCT: 47.4 % (ref 39.0–52.0)
Hemoglobin: 16.6 g/dL (ref 13.0–17.0)
MCH: 30.2 pg (ref 26.0–34.0)
MCHC: 35 g/dL (ref 30.0–36.0)
MCV: 86.3 fL (ref 80.0–100.0)
Platelets: 208 10*3/uL (ref 150–400)
RBC: 5.49 MIL/uL (ref 4.22–5.81)
RDW: 12.2 % (ref 11.5–15.5)
WBC: 6.8 10*3/uL (ref 4.0–10.5)
nRBC: 0 % (ref 0.0–0.2)

## 2021-02-11 LAB — BASIC METABOLIC PANEL
Anion gap: 6 (ref 5–15)
BUN: 30 mg/dL — ABNORMAL HIGH (ref 6–20)
CO2: 24 mmol/L (ref 22–32)
Calcium: 9 mg/dL (ref 8.9–10.3)
Chloride: 104 mmol/L (ref 98–111)
Creatinine, Ser: 1.47 mg/dL — ABNORMAL HIGH (ref 0.61–1.24)
GFR, Estimated: 57 mL/min — ABNORMAL LOW (ref >60)
Glucose, Bld: 156 mg/dL — ABNORMAL HIGH (ref 70–99)
Potassium: 3.2 mmol/L — ABNORMAL LOW (ref 3.5–5.1)
Sodium: 134 mmol/L — ABNORMAL LOW (ref 135–145)

## 2021-02-11 LAB — HIV ANTIBODY (ROUTINE TESTING W REFLEX): HIV Screen 4th Generation wRfx: NONREACTIVE

## 2021-02-11 LAB — GLUCOSE, CAPILLARY: Glucose-Capillary: 171 mg/dL — ABNORMAL HIGH (ref 70–99)

## 2021-02-11 MED ORDER — HYDROCODONE-ACETAMINOPHEN 5-325 MG PO TABS
1.0000 | ORAL_TABLET | Freq: Four times a day (QID) | ORAL | Status: DC | PRN
Start: 2021-02-11 — End: 2021-02-11

## 2021-02-11 MED ORDER — OLMESARTAN MEDOXOMIL-HCTZ 40-25 MG PO TABS: 1.0000 | ORAL_TABLET | Freq: Every day | ORAL | Status: DC

## 2021-02-11 MED ORDER — FLUOXETINE HCL 20 MG PO CAPS
20.0000 mg | ORAL_CAPSULE | Freq: Every day | ORAL | Status: DC
  Administered 2021-02-11: 20 mg via ORAL
  Filled 2021-02-11: qty 1

## 2021-02-11 NOTE — ED Notes (Signed)
Lab at bedside

## 2021-02-11 NOTE — Discharge Summary (Signed)
Physician Discharge Summary  Russell Roach:[id number] DOB: 08-30-1971 DOA: 02/10/2021  PCP: Rick Duff, PA-C  Admit date: 02/10/2021 Discharge date: 02/11/2021  Admitted From: Home  Discharge disposition: Home  Recommendations for Outpatient Follow-Up:   . Follow up with your primary care provider in one week.  . Check CBC, BMP, magnesium in the next visit . Patient will need to follow-up with his endocrinologist regarding management of diabetes.  Ozempic was discontinued at this time.  Discharge Diagnosis:   Principal Problem:   AKI (acute kidney injury) (HCC) Active Problems:   Hypertension   Hyperlipidemia   Type 2 diabetes mellitus with hyperglycemia (HCC)   Polycythemia   Hyponatremia   Aortic atherosclerosis (HCC)   Discharge Condition: Improved.  Diet recommendation: Low sodium, heart healthy.  Carbohydrate-modified.    Wound care: None.  Code status: Full.   History of Present Illness:   Russell Roach is a 50 y.o. male with medical history significant of hyperlipidemia, hypertension, type 2 diabetes mellitus presented to the hospital with decreased appetite, abdominal pain and episode of diarrhea.  Patient persisted to have symptoms so was admitted to hospital.  CT abdomen/pelvis without contrast did not show any acute pathology.    Hospital Course:   Following conditions were addressed during hospitalization as listed below,  Acute kidney injury secondary to nausea vomiting and diarrhea. Improved with IV fluid hydration.  Thought to be secondary to adverse effects from Ozempic.  Nausea vomiting and diarrhea has improved at this time.  Patient was encouraged oral intake.  Ozempic was discontinued on discharge.  Patient was advised to follow-up with his endocrinologist as outpatient for further options  Polycythemia Will need to follow-up as outpatient    Hyponatremia Mild.  Continue to monitor as outpatient.    Mild hypokalemia.   Patient  was given potassium supplements prior to discharge.  Advised outpatient BMP    Essential hypertension On olmesartan hydrochlorothiazide as outpatient.    Hyperlipidemia Continue WelChol    Type 2 diabetes mellitus with hyperglycemia  Sliding-scale insulin, Accu-Cheks, diabetic diet.  Disposition.  At this time, patient is stable for disposition home with outpatient PCP follow-up.  Patient was advised to follow-up with endocrinologist as outpatient as well  Medical Consultants:    None.  Procedures:    None Subjective:   Today, patient was seen and examined at bedside.  Denies any nausea vomiting or diarrhea.  Feels well and wishes to go home.  Discharge Exam:   Vitals:   02/11/21 0317 02/11/21 0336  BP:  123/79  Pulse:  69  Resp:  20  Temp: 97.6 F (36.4 C) 98.2 F (36.8 C)  SpO2:  96%   Vitals:   02/11/21 0230 02/11/21 0315 02/11/21 0317 02/11/21 0336  BP: 116/78 119/82  123/79  Pulse: 72 72  69  Resp: 20 20  20   Temp:   97.6 F (36.4 C) 98.2 F (36.8 C)  TempSrc:   Oral Oral  SpO2: 95% 96%  96%  Weight:      Height:       General: Alert awake, not in obvious distress HENT: pupils equally reacting to light,  No scleral pallor or icterus noted. Oral mucosa is moist.  Chest:  Clear breath sounds.  Diminished breath sounds bilaterally. No crackles or wheezes.  CVS: S1 &S2 heard. No murmur.  Regular rate and rhythm. Abdomen: Soft, nontender, nondistended.  Bowel sounds are heard.   Extremities: No cyanosis, clubbing or edema.  Peripheral pulses are palpable. Psych: Alert, awake and oriented, normal mood CNS:  No cranial nerve deficits.  Power equal in all extremities.   Skin: Warm and dry.  No rashes noted.  The results of significant diagnostics from this hospitalization (including imaging, microbiology, ancillary and laboratory) are listed below for reference.     Diagnostic Studies:   CT ABDOMEN PELVIS WO CONTRAST  Result Date: 02/10/2021 CLINICAL  DATA:  Abdominal pain, diarrhea, nausea and vomiting for 1 week EXAM: CT ABDOMEN AND PELVIS WITHOUT CONTRAST TECHNIQUE: Multidetector CT imaging of the abdomen and pelvis was performed following the standard protocol without IV contrast. Unenhanced CT was performed per clinician order. Lack of IV contrast limits sensitivity and specificity, especially for evaluation of abdominal/pelvic solid viscera. COMPARISON:  None. FINDINGS: Lower chest: No acute pleural or parenchymal lung disease. Hepatobiliary: No focal liver abnormality is seen. No gallstones, gallbladder wall thickening, or biliary dilatation. Pancreas: Unremarkable. No pancreatic ductal dilatation or surrounding inflammatory changes. Spleen: Normal in size without focal abnormality. Adrenals/Urinary Tract: No urinary tract calculi or obstructive uropathy. The adrenals and bladder are unremarkable. Stomach/Bowel: No bowel obstruction or ileus. Normal appendix right lower quadrant. No bowel wall thickening or inflammatory change. Vascular/Lymphatic: Aortic atherosclerosis. No enlarged abdominal or pelvic lymph nodes. Reproductive: Prostate is unremarkable. Other: No free fluid or free intraperitoneal gas. No abdominal wall hernia. Musculoskeletal: No acute or destructive bony lesions. Reconstructed images demonstrate no additional findings. IMPRESSION: 1. Unremarkable unenhanced CT of the abdomen and pelvis. 2.  Aortic Atherosclerosis (ICD10-I70.0). Electronically Signed   By: Sharlet Salina M.D.   On: 02/10/2021 22:47     Labs:   Basic Metabolic Panel: Recent Labs  Lab 02/10/21 1637 02/11/21 0252  NA 132* 134*  K 4.6 3.2*  CL 97* 104  CO2 22 24  GLUCOSE 262* 156*  BUN 36* 30*  CREATININE 2.36* 1.47*  CALCIUM 10.0 9.0   GFR Estimated Creatinine Clearance: 63.9 mL/min (A) (by C-G formula based on SCr of 1.47 mg/dL (H)). Liver Function Tests: Recent Labs  Lab 02/10/21 1637  AST 20  ALT 28  ALKPHOS 79  BILITOT 0.8  PROT 7.2   ALBUMIN 4.3   Recent Labs  Lab 02/10/21 1637  LIPASE 46   No results for input(s): AMMONIA in the last 168 hours. Coagulation profile No results for input(s): INR, PROTIME in the last 168 hours.  CBC: Recent Labs  Lab 02/10/21 1637 02/11/21 0252  WBC 8.8 6.8  NEUTROABS 5.2  --   HGB 18.5* 16.6  HCT 52.5* 47.4  MCV 85.1 86.3  PLT 265 208   Cardiac Enzymes: No results for input(s): CKTOTAL, CKMB, CKMBINDEX, TROPONINI in the last 168 hours. BNP: Invalid input(s): POCBNP CBG: Recent Labs  Lab 02/10/21 1629 02/11/21 0356  GLUCAP 269* 171*   D-Dimer No results for input(s): DDIMER in the last 72 hours. Hgb A1c No results for input(s): HGBA1C in the last 72 hours. Lipid Profile No results for input(s): CHOL, HDL, LDLCALC, TRIG, CHOLHDL, LDLDIRECT in the last 72 hours. Thyroid function studies No results for input(s): TSH, T4TOTAL, T3FREE, THYROIDAB in the last 72 hours.  Invalid input(s): FREET3 Anemia work up No results for input(s): VITAMINB12, FOLATE, FERRITIN, TIBC, IRON, RETICCTPCT in the last 72 hours. Microbiology Recent Results (from the past 240 hour(s))  Resp Panel by RT-PCR (Flu A&B, Covid) Nasopharyngeal Swab     Status: None   Collection Time: 02/10/21  9:08 PM   Specimen: Nasopharyngeal Swab; Nasopharyngeal(NP) swabs in vial  transport medium  Result Value Ref Range Status   SARS Coronavirus 2 by RT PCR NEGATIVE NEGATIVE Final    Comment: (NOTE) SARS-CoV-2 target nucleic acids are NOT DETECTED.  The SARS-CoV-2 RNA is generally detectable in upper respiratory specimens during the acute phase of infection. The lowest concentration of SARS-CoV-2 viral copies this assay can detect is 138 copies/mL. A negative result does not preclude SARS-Cov-2 infection and should not be used as the sole basis for treatment or other patient management decisions. A negative result may occur with  improper specimen collection/handling, submission of specimen  other than nasopharyngeal swab, presence of viral mutation(s) within the areas targeted by this assay, and inadequate number of viral copies(<138 copies/mL). A negative result must be combined with clinical observations, patient history, and epidemiological information. The expected result is Negative.  Fact Sheet for Patients:  BloggerCourse.com  Fact Sheet for Healthcare Providers:  SeriousBroker.it  This test is no t yet approved or cleared by the [place] FDA and  has been authorized for detection and/or diagnosis of SARS-CoV-2 by FDA under an Emergency Use Authorization (EUA). This EUA will remain  in effect (meaning this test can be used) for the duration of the COVID-19 declaration under Section 564(b)(1) of the Act, 21 U.S.C.section 360bbb-3(b)(1), unless the authorization is terminated  or revoked sooner.       Influenza A by PCR NEGATIVE NEGATIVE Final   Influenza B by PCR NEGATIVE NEGATIVE Final    Comment: (NOTE) The Xpert Xpress SARS-CoV-2/FLU/RSV plus assay is intended as an aid in the diagnosis of influenza from Nasopharyngeal swab specimens and should not be used as a sole basis for treatment. Nasal washings and aspirates are unacceptable for Xpert Xpress SARS-CoV-2/FLU/RSV testing.  Fact Sheet for Patients: BloggerCourse.com  Fact Sheet for Healthcare Providers: SeriousBroker.it  This test is not yet approved or cleared by the [place] FDA and has been authorized for detection and/or diagnosis of SARS-CoV-2 by FDA under an Emergency Use Authorization (EUA). This EUA will remain in effect (meaning this test can be used) for the duration of the COVID-19 declaration under Section 564(b)(1) of the Act, 21 U.S.C. section 360bbb-3(b)(1), unless the authorization is terminated or revoked.  Performed at [facility], [address] [place],  [place] [address]      Discharge Instructions:   Discharge Instructions    Diet - low sodium heart healthy   Complete by: As directed    Discharge instructions   Complete by: As directed    Follow up with your primary care provider in one week. Check blood work at that time. Follow up with your endocrinologist regarding diabetes. Ozempic has been discontinued at this time. Increase fluid intake.   Increase activity slowly   Complete by: As directed      Allergies as of 02/11/2021      Reactions   Simvastatin Other (See Comments)   Muscle aches.   Sitagliptin    Other reaction(s): Abdominal Pain      Medication List    STOP taking these medications   ibuprofen 200 MG tablet Commonly known as: ADVIL   Ozempic (1 MG/DOSE) 4 MG/3ML Sopn Generic drug: Semaglutide (1 MG/DOSE)     TAKE these medications   acetaminophen 325 MG tablet Commonly known as: TYLENOL Take 650 mg by mouth every 6 (six) hours as needed for mild pain.   chlorpheniramine 4 MG tablet Commonly known as: CHLOR-TRIMETON Take 4 mg by mouth every 6 (six) hours as  needed for allergies.   colesevelam 625 MG tablet Commonly known as: WELCHOL Take 1,875 mg by mouth with breakfast, with lunch, and with evening meal.   FLUoxetine 20 MG capsule Commonly known as: PROZAC Take 20 mg by mouth daily.   HYDROcodone-acetaminophen 5-325 MG tablet Commonly known as: NORCO/VICODIN Take 1 tablet by mouth every 6 (six) hours as needed for moderate pain.   metFORMIN 1000 MG tablet Commonly known as: GLUCOPHAGE Take 1,000 mg by mouth 2 (two) times daily with a meal.   olmesartan-hydrochlorothiazide 40-25 MG tablet Commonly known as: BENICAR HCT Take 1 tablet by mouth daily. Start taking on: Feb 12, 2021   ondansetron 8 MG disintegrating tablet Commonly known as: ZOFRAN-ODT Take 8 mg by mouth 2 (two) times daily as needed.       Follow-up Information    Rick DuffMichaels, Ashley L, PA-C. Schedule an appointment as soon as  possible for a visit in 1 week(s).   Specialty: Physician Assistant Why: blood work Contact information: [address] [place] [id number] [phone]                Time coordinating discharge: 39 minutes  Signed:  Elizaveta Mattice  [facility] 02/11/2021, 10:13 AM

## 2021-02-11 NOTE — ED Notes (Signed)
Attempted to call report - no answer at this time-  

## 2021-02-11 NOTE — ED Notes (Signed)
Pt ambulated to restroom. 

## 2021-02-11 NOTE — Progress Notes (Signed)
Inpatient Diabetes Program Recommendations  AACE/ADA: New Consensus Statement on Inpatient Glycemic Control (2015)  Target Ranges:  Prepandial:   less than 140 mg/dL      Peak postprandial:   less than 180 mg/dL (1-2 hours)      Critically ill patients:  140 - 180 mg/dL   Lab Results  Component Value Date   GLUCAP 171 (H) 02/11/2021    Review of Glycemic Control Results for MARQUIN, PATINO (MRN [id number]) as of 02/11/2021 09:03  Ref. Range 02/10/2021 16:29 02/11/2021 03:56  Glucose-Capillary Latest Ref Range: 70 - 99 mg/dL 022 (H) 336 (H)   Diabetes history: DM 2 Outpatient Diabetes medications: Metformin 1000 mg bid, Ozempic 1mg  Qthursday Current orders for Inpatient glycemic control:  None  Inpatient Diabetes Program Recommendations:    - start Novolog (very sensitive scale) 0-6 units tid + hs   Thanks, 10-07-1980 RN, MSN, BC-ADM Inpatient Diabetes Coordinator Team Pager [phone] (8a-5p)

## 2021-02-11 NOTE — ED Notes (Signed)
Pt and family member made aware of room assignment at this time

## 2021-10-31 ENCOUNTER — Encounter (HOSPITAL_COMMUNITY): Payer: Self-pay

## 2021-10-31 ENCOUNTER — Other Ambulatory Visit: Payer: Self-pay

## 2021-10-31 ENCOUNTER — Emergency Department (HOSPITAL_COMMUNITY)
Admission: EM | Admit: 2021-10-31 | Discharge: 2021-10-31 | Disposition: A | Discharge status: Home / Self Care | Payer: Managed Care, Other (non HMO) | Attending: Emergency Medicine | Admitting: Emergency Medicine

## 2021-10-31 ENCOUNTER — Emergency Department (HOSPITAL_COMMUNITY): Payer: Managed Care, Other (non HMO)

## 2021-10-31 DIAGNOSIS — R1032 Left lower quadrant pain: Secondary | ICD-10-CM | POA: Diagnosis not present

## 2021-10-31 DIAGNOSIS — R1031 Right lower quadrant pain: Secondary | ICD-10-CM | POA: Diagnosis present

## 2021-10-31 DIAGNOSIS — Z7984 Long term (current) use of oral hypoglycemic drugs: Secondary | ICD-10-CM | POA: Insufficient documentation

## 2021-10-31 DIAGNOSIS — R1030 Lower abdominal pain, unspecified: Secondary | ICD-10-CM

## 2021-10-31 DIAGNOSIS — Z79899 Other long term (current) drug therapy: Secondary | ICD-10-CM | POA: Insufficient documentation

## 2021-10-31 LAB — URINALYSIS, ROUTINE W REFLEX MICROSCOPIC
Bilirubin Urine: NEGATIVE
Glucose, UA: NEGATIVE mg/dL
Hgb urine dipstick: NEGATIVE
Ketones, ur: NEGATIVE mg/dL
Leukocytes,Ua: NEGATIVE
Nitrite: NEGATIVE
Protein, ur: NEGATIVE mg/dL
Specific Gravity, Urine: 1.023 (ref 1.005–1.030)
pH: 8 (ref 5.0–8.0)

## 2021-10-31 LAB — CBC WITH DIFFERENTIAL/PLATELET
Abs Immature Granulocytes: 0.02 10*3/uL (ref 0.00–0.07)
Basophils Absolute: 0.1 10*3/uL (ref 0.0–0.1)
Basophils Relative: 1 %
Eosinophils Absolute: 0.1 10*3/uL (ref 0.0–0.5)
Eosinophils Relative: 1 %
HCT: 42.8 % (ref 39.0–52.0)
Hemoglobin: 15.5 g/dL (ref 13.0–17.0)
Immature Granulocytes: 0 %
Lymphocytes Relative: 34 %
Lymphs Abs: 2.2 10*3/uL (ref 0.7–4.0)
MCH: 31 pg (ref 26.0–34.0)
MCHC: 36.2 g/dL — ABNORMAL HIGH (ref 30.0–36.0)
MCV: 85.6 fL (ref 80.0–100.0)
Monocytes Absolute: 0.6 10*3/uL (ref 0.1–1.0)
Monocytes Relative: 9 %
Neutro Abs: 3.5 10*3/uL (ref 1.7–7.7)
Neutrophils Relative %: 55 %
Platelets: 204 10*3/uL (ref 150–400)
RBC: 5 MIL/uL (ref 4.22–5.81)
RDW: 13.1 % (ref 11.5–15.5)
WBC: 6.5 10*3/uL (ref 4.0–10.5)
nRBC: 0 % (ref 0.0–0.2)

## 2021-10-31 LAB — COMPREHENSIVE METABOLIC PANEL
ALT: 24 U/L (ref 0–44)
AST: 21 U/L (ref 15–41)
Albumin: 4.2 g/dL (ref 3.5–5.0)
Alkaline Phosphatase: 68 U/L (ref 38–126)
Anion gap: 11 (ref 5–15)
BUN: 11 mg/dL (ref 6–20)
CO2: 22 mmol/L (ref 22–32)
Calcium: 9.1 mg/dL (ref 8.9–10.3)
Chloride: 103 mmol/L (ref 98–111)
Creatinine, Ser: 0.88 mg/dL (ref 0.61–1.24)
GFR, Estimated: >60 mL/min (ref >60)
Glucose, Bld: 162 mg/dL — ABNORMAL HIGH (ref 70–99)
Potassium: 3.8 mmol/L (ref 3.5–5.1)
Sodium: 136 mmol/L (ref 135–145)
Total Bilirubin: 0.9 mg/dL (ref 0.3–1.2)
Total Protein: 7.1 g/dL (ref 6.5–8.1)

## 2021-10-31 LAB — LIPASE, BLOOD: Lipase: 51 U/L (ref 11–51)

## 2021-10-31 MED ORDER — SODIUM CHLORIDE (PF) 0.9 % IJ SOLN
INTRAMUSCULAR | Status: AC
  Filled 2021-10-31: qty 50

## 2021-10-31 MED ORDER — IOHEXOL 300 MG/ML  SOLN
100.0000 mL | Freq: Once | INTRAMUSCULAR | Status: AC | PRN
  Administered 2021-10-31: 100 mL via INTRAVENOUS

## 2021-10-31 MED ORDER — MORPHINE SULFATE (PF) 4 MG/ML IV SOLN
4.0000 mg | Freq: Once | INTRAVENOUS | Status: AC
  Administered 2021-10-31: 4 mg via INTRAVENOUS
  Filled 2021-10-31: qty 1

## 2021-10-31 MED ORDER — LACTATED RINGERS IV BOLUS
1000.0000 mL | Freq: Once | INTRAVENOUS | Status: AC
  Administered 2021-10-31: 1000 mL via INTRAVENOUS

## 2021-10-31 NOTE — ED Provider Triage Note (Signed)
Emergency Medicine Provider Triage Evaluation Note  Russell Roach , a 51 y.o. male  was evaluated in triage.  Pt complains of "diverticulitis."  States that he has been having bouts of this for the past month.  Was given amoxicillin but this did not help him and the pain continues to be there.  Reports occasional episodes of emesis over this time.  He says that his job gave him amoxicillin for this and it has not helped.  Review of Systems  Positive: Diarrhea, nausea and vomiting Negative: Fevers or chills  Physical Exam  BP (!) 152/104 (BP Location: Left Arm)    Pulse 94    Temp 98.1 F (36.7 C) (Oral)    Resp 16    SpO2 98%  Gen:   Awake, no distress   Resp:  Normal effort  MSK:   Moves extremities without difficulty  Other:  Nontender abdomen, reports it is "inside."  Medical Decision Making  Medically screening exam initiated at 10:10 AM.  Appropriate orders placed.  RIDGE LAFOND was informed that the remainder of the evaluation will be completed by another provider, this initial triage assessment does not replace that evaluation, and the importance of remaining in the ED until their evaluation is complete.    Low suspicion for diverticulitis.  No abdominal tenderness.  Ordered for patient's peace of mind.   Saddie Benders, PA-C 10/31/21 1014

## 2021-10-31 NOTE — ED Triage Notes (Addendum)
Pt reports lower abdominal pain and N/V/D for about 2 months. Pt reports being seen for same prior and given amoxicillin for diverticulitis, but has not had any relief. Denies blood in stool or emesis.

## 2021-10-31 NOTE — ED Provider Notes (Signed)
[facility] DEPT Provider Note   CSN: [id number] Arrival date & time: 10/31/21  1005     History  Chief Complaint  Patient presents with   Abdominal Pain    Russell Roach is a 51 y.o. male.   Abdominal Pain Pain location:  LLQ and RLQ Pain quality: aching and cramping   Pain radiates to:  Does not radiate Pain severity:  Moderate Onset quality:  Gradual Duration:  4 weeks Timing:  Intermittent Progression:  Worsening Chronicity:  New Context: eating   Relieved by:  Nothing Worsened by:  Movement Associated symptoms: diarrhea, nausea and vomiting   Associated symptoms: no chest pain, no chills, no cough, no dysuria, no fever, no flatus, no hematuria, no shortness of breath and no sore throat   Patient presents for abdominal pain.    Home Medications Prior to Admission medications   Medication Sig Start Date End Date Taking? Authorizing Provider  acetaminophen (TYLENOL) 325 MG tablet Take 650 mg by mouth every 6 (six) hours as needed for mild pain.    [provider]  chlorpheniramine (CHLOR-TRIMETON) 4 MG tablet Take 4 mg by mouth every 6 (six) hours as needed for allergies.    [provider]  colesevelam (WELCHOL) 625 MG tablet Take 1,875 mg by mouth with breakfast, with lunch, and with evening meal. 07/17/20   [provider]  FLUoxetine (PROZAC) 20 MG capsule Take 20 mg by mouth daily. 01/28/21   [provider]  HYDROcodone-acetaminophen (NORCO/VICODIN) 5-325 MG tablet Take 1 tablet by mouth every 6 (six) hours as needed for moderate pain. 02/09/21   [provider]  metFORMIN (GLUCOPHAGE) 1000 MG tablet Take 1,000 mg by mouth 2 (two) times daily with a meal.    [provider]  olmesartan-hydrochlorothiazide (BENICAR HCT) 40-25 MG tablet Take 1 tablet by mouth daily. 02/12/21   Pokhrel, Corrie Mckusick, MD  ondansetron (ZOFRAN-ODT) 8 MG disintegrating tablet Take 8 mg by mouth 2 (two) times daily  as needed. 02/09/21   [provider]      Allergies    Simvastatin and Sitagliptin    Review of Systems   Review of Systems  Constitutional:  Positive for appetite change. Negative for activity change, chills and fever.  HENT:  Negative for congestion, ear pain and sore throat.   Eyes:  Negative for pain and visual disturbance.  Respiratory:  Negative for cough, chest tightness and shortness of breath.   Cardiovascular:  Negative for chest pain, palpitations and leg swelling.  Gastrointestinal:  Positive for abdominal pain, diarrhea, nausea and vomiting. Negative for blood in stool and flatus.  Genitourinary:  Negative for dysuria, flank pain, hematuria, penile pain, penile swelling and testicular pain.  Musculoskeletal:  Negative for arthralgias, back pain, joint swelling, myalgias and neck pain.  Skin:  Negative for color change and rash.  Neurological:  Negative for dizziness, seizures, syncope, weakness, numbness and headaches.  Hematological:  Does not bruise/bleed easily.  Psychiatric/Behavioral:  Negative for confusion and decreased concentration.   All other systems reviewed and are negative.  Physical Exam Updated Vital Signs BP (!) 170/90    Pulse 70    Temp 98.1 F (36.7 C) (Oral)    Resp 18    SpO2 94%  Physical Exam Vitals and nursing note reviewed.  Constitutional:      General: He is not in acute distress.    Appearance: He is well-developed and normal weight. He is not ill-appearing, toxic-appearing or diaphoretic.  HENT:  Head: Normocephalic and atraumatic.     Mouth/Throat:     Mouth: Mucous membranes are moist.     Pharynx: Oropharynx is clear.  Eyes:     General: No scleral icterus.    Extraocular Movements: Extraocular movements intact.     Conjunctiva/sclera: Conjunctivae normal.  Cardiovascular:     Rate and Rhythm: Normal rate and regular rhythm.     Heart sounds: No murmur heard. Pulmonary:     Effort: Pulmonary effort is normal. No  respiratory distress.     Breath sounds: Normal breath sounds. No wheezing or rales.  Abdominal:     Palpations: Abdomen is soft.     Tenderness: There is no abdominal tenderness.  Musculoskeletal:        General: No swelling.     Cervical back: Neck supple.  Skin:    General: Skin is warm and dry.     Capillary Refill: Capillary refill takes less than 2 seconds.     Coloration: Skin is not cyanotic, jaundiced or pale.  Neurological:     General: No focal deficit present.     Mental Status: He is alert and oriented to person, place, and time.     Cranial Nerves: No cranial nerve deficit.     Motor: No weakness.  Psychiatric:        Mood and Affect: Mood normal.        Behavior: Behavior normal.    ED Results / Procedures / Treatments   Labs (all labs ordered are listed, but only abnormal results are displayed) Labs Reviewed  COMPREHENSIVE METABOLIC PANEL - Abnormal; Notable for the following components:      Result Value   Glucose, Bld 162 (*)    All other components within normal limits  CBC WITH DIFFERENTIAL/PLATELET - Abnormal; Notable for the following components:   MCHC 36.2 (*)    All other components within normal limits  URINALYSIS, ROUTINE W REFLEX MICROSCOPIC - Abnormal; Notable for the following components:   Color, Urine STRAW (*)    All other components within normal limits  LIPASE, BLOOD    EKG None  Radiology CT ABDOMEN PELVIS W CONTRAST  Result Date: 10/31/2021 CLINICAL DATA:  Acute abdominal pain. EXAM: CT ABDOMEN AND PELVIS WITH CONTRAST TECHNIQUE: Multidetector CT imaging of the abdomen and pelvis was performed using the standard protocol following bolus administration of intravenous contrast. RADIATION DOSE REDUCTION: This exam was performed according to the departmental dose-optimization program which includes automated exposure control, adjustment of the mA and/or kV according to patient size and/or use of iterative reconstruction technique.  CONTRAST:  170mL OMNIPAQUE IOHEXOL 300 MG/ML  SOLN COMPARISON:  CT abdomen pelvis 10/09/2021. FINDINGS: Lower chest: Normal heart size. Dependent atelectasis within the bilateral lower lobes. 4 mm left lower lobe pulmonary nodule (image 28; series 4). Hepatobiliary: Liver is normal in size and contour. No focal lesions identified. Gallbladder unremarkable. No intrahepatic or extrahepatic biliary ductal dilatation. Pancreas: Unremarkable Spleen: Unremarkable Adrenals/Urinary Tract: Normal adrenal glands. Kidneys enhance symmetrically with contrast. No hydronephrosis. Urinary bladder is unremarkable. Stomach/Bowel: The distal tip of the appendix contains high attenuation material however is not dilated and there is no significant surrounding inflammatory stranding. No abnormal bowel wall thickening or evidence for bowel obstruction. No free fluid or free intraperitoneal air. Normal morphology of the stomach. Vascular/Lymphatic: Normal caliber abdominal aorta. Peripheral calcified atherosclerotic plaque. No retroperitoneal lymphadenopathy. Reproductive: Rollene Fare prostate. Other: None. Musculoskeletal: Lower thoracic and lumbar spine degenerative changes. IMPRESSION: No acute process within the  abdomen or pelvis. 4 mm left solid pulmonary nodule. No routine follow-up imaging is recommended per Fleischner Society Guidelines. These guidelines do not apply to immunocompromised patients and patients with cancer. Follow up in patients with significant comorbidities as clinically warranted. For lung cancer screening, adhere to Lung-RADS guidelines. Reference: Radiology. 2017; 284(1):228-43. Electronically Signed   By: Lovey Newcomer M.D.   On: 10/31/2021 12:09    Procedures Procedures    Medications Ordered in ED Medications  sodium chloride (PF) 0.9 % injection (has no administration in time range)  lactated ringers bolus 1,000 mL (0 mLs Intravenous Stopped 10/31/21 1241)  morphine 4 MG/ML injection 4 mg (4 mg  Intravenous Given 10/31/21 1118)  iohexol (OMNIPAQUE) 300 MG/ML solution 100 mL (100 mLs Intravenous Contrast Given 10/31/21 1130)    ED Course/ Medical Decision Making/ A&P                           Medical Decision Making Risk Prescription drug management.   This patient presents to the ED for concern of abdominal pain, nausea, and one episode of vomiting, this involves an extensive number of treatment options, and is a complaint that carries with it a high risk of complications and morbidity.  The differential diagnosis includes worsening diverticulitis, enteritis, PUD, gastritis, GERD   Co morbidities that complicate the patient evaluation  T2DM, HLD, HTN, recent diverticulitis   Additional history obtained:  Additional history obtained from N/A External records from outside source obtained and reviewed including EMR   Lab Tests:  I Ordered, and personally interpreted labs.  The pertinent results include: Normal findings, no leukocytosis   Imaging Studies ordered:  I ordered imaging studies including CT of abdomen and pelvis I independently visualized and interpreted imaging which showed high attenuation material in distal tip of the appendix but no associated inflammatory stranding or dilation.  No further evidence of diverticulitis. I agree with the radiologist interpretation   Cardiac Monitoring:  The patient was maintained on a cardiac monitor.  I personally viewed and interpreted the cardiac monitored which showed an underlying rhythm of: Sinus rhythm   Medicines ordered and prescription drug management:  I ordered medication including mood and morphine for symptomatic relief Reevaluation of the patient after these medicines showed that the patient resolved I have reviewed the patients home medicines and have made adjustments as needed   Problem List / ED Course:  Healthy 51 year old male presenting for concern of worsening diverticulitis.  He was diagnosed  3 weeks ago and finished a 10-day course of antibiotics.  He had improvement in his abdominal pain but recently had worsening abdominal pain along the lower aspect of his abdomen.  He additionally had nausea with an episode of vomiting last night.  On arrival in the ED, patient is afebrile.  Vital signs are notable for hypertension.  He is well-appearing on exam.  He describes a deep lower abdominal pain without tenderness.  Lab work shows normal electrolytes, no leukocytosis, no evidence of blood loss.  CT scan of abdomen pelvis showed no acute findings to explain the symptoms.  Patient was given reassurance.  He was able to tolerate p.o. intake in the ED without difficulty.   Reevaluation:  After the interventions noted above, I reevaluated the patient and found that they have :resolved   Social Determinants of Health:  This patient has family support at home and access to outpatient medical care.     Dispostion:  After consideration  of the diagnostic results and the patients response to treatment, I feel that the patent would benefit from Discharge with PCP follow-up.          Final Clinical Impression(s) / ED Diagnoses Final diagnoses:  Lower abdominal pain    Rx / DC Orders ED Discharge Orders     None         Godfrey Pick, MD 10/31/21 1329

## 2021-10-31 NOTE — ED Notes (Signed)
Patient given water for PO challenge.  

## 2022-04-26 ENCOUNTER — Ambulatory Visit: Payer: Managed Care, Other (non HMO) | Admitting: Physician Assistant

## 2022-04-26 ENCOUNTER — Encounter: Payer: Self-pay | Admitting: Physician Assistant

## 2022-04-26 ENCOUNTER — Ambulatory Visit (INDEPENDENT_AMBULATORY_CARE_PROVIDER_SITE_OTHER): Payer: 59

## 2022-04-26 VITALS — Ht 68.0 in | Wt 185.0 lb

## 2022-04-26 DIAGNOSIS — M1712 Unilateral primary osteoarthritis, left knee: Secondary | ICD-10-CM

## 2022-04-26 MED ORDER — METHYLPREDNISOLONE ACETATE 40 MG/ML IJ SUSP
40.0000 mg | INTRAMUSCULAR | Status: AC | PRN
  Administered 2022-04-26: 40 mg via INTRA_ARTICULAR

## 2022-04-26 MED ORDER — LIDOCAINE HCL 1 % IJ SOLN
3.0000 mL | INTRAMUSCULAR | Status: AC | PRN
  Administered 2022-04-26: 3 mL

## 2022-04-26 NOTE — Progress Notes (Signed)
Office Visit Note   Patient: Russell Roach           Date of Birth: [birth year]           MRN: [id number] Visit Date: 04/26/2022              Requested by: Franki Cabot, FNP [address] [place],  [place] [address] PCP: Franki Cabot, FNP   Assessment & Plan: Visit Diagnoses:  1. Primary osteoarthritis of left knee     Plan:  Quad strengthening exercises shown.  He would like to follow-up with [place] as needed.  He will remove the Ace bandage this evening before going to bed.  Questions were encouraged and answered at length  Follow-Up Instructions: Return if symptoms worsen or fail to improve.   Orders:  Orders Placed This Encounter  Procedures   Large Joint Inj   XR Knee 1-2 Views Left   No orders of the defined types were placed in this encounter.     Procedures: Large Joint Inj: L knee on 04/26/2022 4:43 PM Indications: pain and joint swelling Details: 22 G 1.5 in needle, superolateral approach  Arthrogram: No  Medications: 3 mL lidocaine 1 %; 40 mg methylPREDNISolone acetate 40 MG/ML Aspirate: 30 mL yellow Outcome: tolerated well, no immediate complications Procedure, treatment alternatives, risks and benefits explained, specific risks discussed. Consent was given by the patient. Immediately prior to procedure a time out was called to verify the correct patient, procedure, equipment, support staff and site/side marked as required. Patient was prepped and draped in the usual sterile fashion.       Clinical Data: No additional findings.   Subjective: Chief Complaint  Patient presents with   Left Knee - Pain    HPI Mr. Russell Roach 51 year old male comes in today with left knee pain.  He reports at the age of 21 he had surgery on that knee and had some "cartilage removed.".  At age 12 he had an ACL reconstruction.  Over the last 5 weeks he has had pain in the knee no known injury.  He notes swelling.  Had multiple joints that were aching he is waiting  to hear from rheumatologist about further lab work he was told that he did not have Lyme disease.  Otherwise he denies any fevers chills.  He has been taking Celebrex doxycycline and he feels the Celebrex definitely has helped with his knee pain.  He is diabetic reports his hemoglobin A1c is down from 12.6 to 8.1.  In past he has tried supplemental injections without any real relief.  Review of Systems See HPI  Objective: Vital Signs: Ht 5\' 8"  (1.727 m)   Wt 185 lb (83.9 kg)   BMI 28.13 kg/m   Physical Exam General: Well-developed well-nourished male no acute distress mood and affect appropriate.  Ambulates without any assistive device.  Has an antalgic gait. Ortho Exam Bilateral knees good range of motion of both knees.  Patellofemoral crepitus left knee.  No abnormal warmth erythema of either knee.  Left knee positive effusion.  No instability valgus varus stressing of either knee.  Left knee anterior drawer is negative. Specialty Comments:  No specialty comments available.  Imaging: XR Knee 1-2 Views Left  Result Date: 04/26/2022 Left knee 2 views: Retained hardware from ACL reconstruction.  No acute fractures.  Tricompartmental arthritis with bone-on-bone medial compartment.  Knee is well located.    PMFS History: Patient Active Problem List   Diagnosis Date Noted  Aortic atherosclerosis (HCC) 02/11/2021   AKI (acute kidney injury) (HCC) 02/10/2021   Type 2 diabetes mellitus (HCC) 02/10/2021   Hypertension    Hyperlipidemia    Type 2 diabetes mellitus with hyperglycemia (HCC)    Polycythemia    Hyponatremia    Past Medical History:  Diagnosis Date   High cholesterol    Hypertension    Type 2 diabetes mellitus (HCC) 02/10/2021    History reviewed. No pertinent family history.  History reviewed. No pertinent surgical history. Social History   Occupational History   Not on file  Tobacco Use   Smoking status: Never   Smokeless tobacco: Never  Substance and Sexual  Activity   Alcohol use: Never   Drug use: Never   Sexual activity: Not on file

## 2022-05-06 ENCOUNTER — Other Ambulatory Visit: Payer: Self-pay

## 2022-05-06 ENCOUNTER — Emergency Department (HOSPITAL_COMMUNITY): Payer: Managed Care, Other (non HMO)

## 2022-05-06 ENCOUNTER — Emergency Department (HOSPITAL_COMMUNITY)
Admission: EM | Admit: 2022-05-06 | Discharge: 2022-05-06 | Disposition: A | Discharge status: Home / Self Care | Payer: Managed Care, Other (non HMO) | Attending: Emergency Medicine | Admitting: Emergency Medicine

## 2022-05-06 ENCOUNTER — Encounter (HOSPITAL_COMMUNITY): Payer: Self-pay

## 2022-05-06 DIAGNOSIS — E119 Type 2 diabetes mellitus without complications: Secondary | ICD-10-CM | POA: Insufficient documentation

## 2022-05-06 DIAGNOSIS — I10 Essential (primary) hypertension: Secondary | ICD-10-CM | POA: Insufficient documentation

## 2022-05-06 DIAGNOSIS — K921 Melena: Secondary | ICD-10-CM | POA: Insufficient documentation

## 2022-05-06 DIAGNOSIS — R531 Weakness: Secondary | ICD-10-CM | POA: Diagnosis not present

## 2022-05-06 DIAGNOSIS — M791 Myalgia, unspecified site: Secondary | ICD-10-CM | POA: Diagnosis not present

## 2022-05-06 LAB — COMPREHENSIVE METABOLIC PANEL
ALT: 17 U/L (ref 0–44)
AST: 12 U/L — ABNORMAL LOW (ref 15–41)
Albumin: 3.8 g/dL (ref 3.5–5.0)
Alkaline Phosphatase: 81 U/L (ref 38–126)
Anion gap: 10 (ref 5–15)
BUN: 13 mg/dL (ref 6–20)
CO2: 21 mmol/L — ABNORMAL LOW (ref 22–32)
Calcium: 9.3 mg/dL (ref 8.9–10.3)
Chloride: 105 mmol/L (ref 98–111)
Creatinine, Ser: 0.77 mg/dL (ref 0.61–1.24)
GFR, Estimated: >60 mL/min (ref >60)
Glucose, Bld: 245 mg/dL — ABNORMAL HIGH (ref 70–99)
Potassium: 3.9 mmol/L (ref 3.5–5.1)
Sodium: 136 mmol/L (ref 135–145)
Total Bilirubin: 1.5 mg/dL — ABNORMAL HIGH (ref 0.3–1.2)
Total Protein: 7.3 g/dL (ref 6.5–8.1)

## 2022-05-06 LAB — CBC WITH DIFFERENTIAL/PLATELET
Abs Immature Granulocytes: 0.06 10*3/uL (ref 0.00–0.07)
Basophils Absolute: 0.1 10*3/uL (ref 0.0–0.1)
Basophils Relative: 1 %
Eosinophils Absolute: 0 10*3/uL (ref 0.0–0.5)
Eosinophils Relative: 0 %
HCT: 44.5 % (ref 39.0–52.0)
Hemoglobin: 15.7 g/dL (ref 13.0–17.0)
Immature Granulocytes: 1 %
Lymphocytes Relative: 14 %
Lymphs Abs: 1.5 10*3/uL (ref 0.7–4.0)
MCH: 30 pg (ref 26.0–34.0)
MCHC: 35.3 g/dL (ref 30.0–36.0)
MCV: 84.9 fL (ref 80.0–100.0)
Monocytes Absolute: 1.3 10*3/uL — ABNORMAL HIGH (ref 0.1–1.0)
Monocytes Relative: 12 %
Neutro Abs: 7.8 10*3/uL — ABNORMAL HIGH (ref 1.7–7.7)
Neutrophils Relative %: 72 %
Platelets: 255 10*3/uL (ref 150–400)
RBC: 5.24 MIL/uL (ref 4.22–5.81)
RDW: 12.1 % (ref 11.5–15.5)
WBC: 10.8 10*3/uL — ABNORMAL HIGH (ref 4.0–10.5)
nRBC: 0 % (ref 0.0–0.2)

## 2022-05-06 LAB — POC OCCULT BLOOD, ED: Fecal Occult Bld: NEGATIVE

## 2022-05-06 LAB — MAGNESIUM: Magnesium: 2 mg/dL (ref 1.7–2.4)

## 2022-05-06 LAB — LIPASE, BLOOD: Lipase: 29 U/L (ref 11–51)

## 2022-05-06 LAB — CK: Total CK: 38 U/L — ABNORMAL LOW (ref 49–397)

## 2022-05-06 MED ORDER — SODIUM CHLORIDE 0.9 % IV BOLUS
1000.0000 mL | Freq: Once | INTRAVENOUS | Status: AC
  Administered 2022-05-06: 1000 mL via INTRAVENOUS

## 2022-05-06 MED ORDER — OXYCODONE HCL 5 MG PO TABS: 5.0000 mg | ORAL_TABLET | Freq: Four times a day (QID) | ORAL | 0 refills | Status: DC | PRN

## 2022-05-06 MED ORDER — METHYLPREDNISOLONE 4 MG PO TBPK: ORAL_TABLET | ORAL | 0 refills | Status: AC

## 2022-05-06 MED ORDER — FENTANYL CITRATE PF 50 MCG/ML IJ SOSY
50.0000 ug | PREFILLED_SYRINGE | Freq: Once | INTRAMUSCULAR | Status: AC
  Administered 2022-05-06: 50 ug via INTRAVENOUS
  Filled 2022-05-06: qty 1

## 2022-05-06 NOTE — Discharge Instructions (Signed)
Take steroids as prescribed.  As stated, if blood sugars are consistently in the 450/500 range please hold steroids and hydrate.  If you unable to get this number under better control,  Consider being evaluated in the ED.  Follow-up with your primary care doctor.

## 2022-05-06 NOTE — ED Provider Notes (Signed)
[facility]GENCY DEPT Provider Note   CSN: [id number] Arrival date & time: 05/06/22  0554     History  Chief Complaint  Patient presents with   Weakness    POWELL HALBERT is a 51 y.o. male.  Patient here with dark stool, generalized body aches.  Black stool started last night.  Has been drinking alcohol heavily.  Then taking Celebrex.  Has been dealing with some generalized body aches and joint pain for the last 6 to 7 weeks.  Has had extensive work-up with primary care doctor that has been thus far unremarkable.  He has been referred to rheumatologist.  He has never had a GI bleed or ulcer.  Started to notice dark stool last night.  He has been self-medicating with alcohol.  Denies any fevers, chills, rash.  Nothing makes it worse or better.  Celebrex has helped some.  He does not have any specific pain in one place.  Denies any headache, vision loss, numbness.  No speech changes.  No vision changes.  The history is provided by the patient.       Home Medications Prior to Admission medications   Medication Sig Start Date End Date Taking? Authorizing Provider  methylPREDNISolone (MEDROL DOSEPAK) 4 MG TBPK tablet Follow package insert 05/06/22  Yes Hugh Kamara, DO  oxyCODONE (ROXICODONE) 5 MG immediate release tablet Take 1 tablet (5 mg total) by mouth every 6 (six) hours as needed for up to 6 doses for severe pain or breakthrough pain. 05/06/22  Yes Reese Stockman, DO  acetaminophen (TYLENOL) 325 MG tablet Take 650 mg by mouth every 6 (six) hours as needed for mild pain.    [provider]  celecoxib (CELEBREX) 200 MG capsule Take 200 mg by mouth daily. 04/20/22   [provider]  doxycycline (VIBRA-TABS) 100 MG tablet Take 100 mg by mouth 2 (two) times daily. 04/15/22   [provider]  OZEMPIC, 1 MG/DOSE, 4 MG/3ML SOPN Inject into the skin. 02/23/22   [provider]      Allergies    Simvastatin and Sitagliptin     Review of Systems   Review of Systems  Physical Exam Updated Vital Signs BP (!) 175/106   Pulse 100   Temp 97.7 F (36.5 C)   Resp 20   Ht 5\' 8"  (1.727 m)   Wt 83.9 kg   SpO2 99%   BMI 28.13 kg/m  Physical Exam Vitals and nursing note reviewed.  Constitutional:      General: He is not in acute distress.    Appearance: He is well-developed. He is not ill-appearing.  HENT:     Head: Normocephalic and atraumatic.     Nose: Nose normal.     Mouth/Throat:     Mouth: Mucous membranes are moist.  Eyes:     Extraocular Movements: Extraocular movements intact.     Conjunctiva/sclera: Conjunctivae normal.     Pupils: Pupils are equal, round, and reactive to light.  Cardiovascular:     Rate and Rhythm: Normal rate and regular rhythm.     Pulses: Normal pulses.     Heart sounds: No murmur heard. Pulmonary:     Effort: Pulmonary effort is normal. No respiratory distress.     Breath sounds: Normal breath sounds.  Abdominal:     General: Abdomen is flat.     Palpations: Abdomen is soft.     Tenderness: There is no abdominal tenderness.  Musculoskeletal:  General: No swelling.     Cervical back: Normal range of motion and neck supple.  Skin:    General: Skin is warm and dry.     Capillary Refill: Capillary refill takes less than 2 seconds.  Neurological:     General: No focal deficit present.     Mental Status: He is alert.     Cranial Nerves: No cranial nerve deficit.     Sensory: No sensory deficit.     Motor: No weakness.     Coordination: Coordination normal.  Psychiatric:        Mood and Affect: Mood normal.     ED Results / Procedures / Treatments   Labs (all labs ordered are listed, but only abnormal results are displayed) Labs Reviewed  CBC WITH DIFFERENTIAL/PLATELET - Abnormal; Notable for the following components:      Result Value   WBC 10.8 (*)    Neutro Abs 7.8 (*)    Monocytes Absolute 1.3 (*)    All other components within normal limits   COMPREHENSIVE METABOLIC PANEL - Abnormal; Notable for the following components:   CO2 21 (*)    Glucose, Bld 245 (*)    AST 12 (*)    Total Bilirubin 1.5 (*)    All other components within normal limits  CK - Abnormal; Notable for the following components:   Total CK 38 (*)    All other components within normal limits  LIPASE, BLOOD  MAGNESIUM  POC OCCULT BLOOD, ED    EKG None  Radiology DG Chest Portable 1 View  Result Date: 05/06/2022 CLINICAL DATA:  51 year old male with generalized chest pain and weakness, worsening over the past 6 weeks. EXAM: PORTABLE CHEST 1 VIEW COMPARISON:  Chest x-ray 05/05/2017. FINDINGS: Lung volumes are normal. No consolidative airspace disease. No pleural effusions. No pneumothorax. No pulmonary nodule or mass noted. Pulmonary vasculature and the cardiomediastinal silhouette are within normal limits. No radiographic evidence of acute cardiopulmonary disease. IMPRESSION: No active disease. Electronically Signed   By: Trudie Reed M.D.   On: 05/06/2022 07:48    Procedures Procedures    Medications Ordered in ED Medications  sodium chloride 0.9 % bolus 1,000 mL (1,000 mLs Intravenous New Bag/Given 05/06/22 0742)  fentaNYL (SUBLIMAZE) injection 50 mcg (50 mcg Intravenous Given 05/06/22 0818)    ED Course/ Medical Decision Making/ A&P                           Medical Decision Making Amount and/or Complexity of Data Reviewed Labs: ordered. Radiology: ordered.  Risk Prescription drug management.   TIMMY BUBECK is here with generalized weakness, black stools.  History of diabetes, hypertension, high cholesterol.  Patient with overall unremarkable vitals.  He has been dealing with generalized body aches and joint pain for the last 2 months.  He is supposed to see rheumatology next month.  He has been self-medicating with alcohol.  He has been prescribed Celebrex.  He states he had a lot of labs ordered by his primary care doctor.  He is on  doxycycline for presumed tickborne illness at 1 point.  He denies any fever.  He had some sort of rash on his back at some point but no obvious concerning rashes on exam.  He he does not have any focal pain in one joint.  He just continues with ongoing discomfort.  Denies any numbness, tingling, vision changes, speech changes.  Denies any IV drug use.  Was seen by orthopedic for this but they focused primarily on his knee.  That has improved.  He does not appear to have any black stool on exam.  Hemoccult is negative.  Overall this sounds like may be some sort of inflammatory process.  Does not appear to have any joint swelling on exam.  No obvious weakness in the limbs on exam but due to discomfort effort dependent.  Overall we will evaluate for dehydration, electrolyte abnormality, infectious process.  We will give IV fluids and IV pain medicine and reevaluate.  Patient feeling slightly better.  Lab work is overall unremarkable per my review and interpretation.  No significant leukocytosis, anemia, electrolyte abnormality, kidney injury.  Blood sugars 245.  Chest x-ray per my review and interpretation shows no pneumonia or pneumothorax.  CK is normal.  Overall shared decision was made to treat with some narcotic pain medicine for breakthrough pain for a few doses and trial a round of steroids.  Did talk with him about how this may affect his blood sugar and recommended that he stop steroids if he is having blood sugars consistently in the 400-500 range and to hydrate and to recheck.  If he is having persistent elevation in his blood sugar to come back for reevaluation and hydration and correction.  However, he is amenable to this plan which I think is reasonable as this is likely an inflammatory process.  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  Sounds like the only abnormality on the blood work that was done by his primary care doctor was an elevated ANA which has triggered a rheumatology follow-up.  Overall I have no concern for an acute neuromuscular process at this time, was given return precautions.  This chart was dictated using voice recognition software.  Despite best efforts to proofread,  errors can occur which can change the documentation meaning.         Final Clinical Impression(s) / ED Diagnoses Final diagnoses:  Generalized weakness    Rx / DC Orders ED Discharge Orders          Ordered    methylPREDNISolone (MEDROL DOSEPAK) 4 MG TBPK tablet        05/06/22 0819    oxyCODONE (ROXICODONE) 5 MG immediate release tablet  Every 6 hours PRN        05/06/22 0819              Virgina Norfolk, DO 05/06/22 [phone]

## 2022-05-06 NOTE — ED Triage Notes (Signed)
Generalized weakness and pain x6 weeks. Reports black stool last night and decreased ROM.  Scheduled to see Rheumatologist at [facility].

## 2022-05-13 ENCOUNTER — Other Ambulatory Visit: Payer: Self-pay

## 2022-05-13 ENCOUNTER — Emergency Department (HOSPITAL_COMMUNITY)
Admission: EM | Admit: 2022-05-13 | Discharge: 2022-05-13 | Disposition: A | Discharge status: Home / Self Care | Payer: Managed Care, Other (non HMO) | Attending: Emergency Medicine | Admitting: Emergency Medicine

## 2022-05-13 DIAGNOSIS — M130 Polyarthritis, unspecified: Secondary | ICD-10-CM | POA: Insufficient documentation

## 2022-05-13 DIAGNOSIS — M791 Myalgia, unspecified site: Secondary | ICD-10-CM | POA: Diagnosis present

## 2022-05-13 DIAGNOSIS — E119 Type 2 diabetes mellitus without complications: Secondary | ICD-10-CM | POA: Insufficient documentation

## 2022-05-13 DIAGNOSIS — I10 Essential (primary) hypertension: Secondary | ICD-10-CM | POA: Insufficient documentation

## 2022-05-13 LAB — CBC
HCT: 46 % (ref 39.0–52.0)
Hemoglobin: 16.5 g/dL (ref 13.0–17.0)
MCH: 30.3 pg (ref 26.0–34.0)
MCHC: 35.9 g/dL (ref 30.0–36.0)
MCV: 84.4 fL (ref 80.0–100.0)
Platelets: 273 10*3/uL (ref 150–400)
RBC: 5.45 MIL/uL (ref 4.22–5.81)
RDW: 12.4 % (ref 11.5–15.5)
WBC: 14.2 10*3/uL — ABNORMAL HIGH (ref 4.0–10.5)
nRBC: 0 % (ref 0.0–0.2)

## 2022-05-13 LAB — COMPREHENSIVE METABOLIC PANEL
ALT: 19 U/L (ref 0–44)
AST: 14 U/L — ABNORMAL LOW (ref 15–41)
Albumin: 3.8 g/dL (ref 3.5–5.0)
Alkaline Phosphatase: 83 U/L (ref 38–126)
Anion gap: 12 (ref 5–15)
BUN: 16 mg/dL (ref 6–20)
CO2: 23 mmol/L (ref 22–32)
Calcium: 9.7 mg/dL (ref 8.9–10.3)
Chloride: 103 mmol/L (ref 98–111)
Creatinine, Ser: 0.87 mg/dL (ref 0.61–1.24)
GFR, Estimated: >60 mL/min (ref >60)
Glucose, Bld: 226 mg/dL — ABNORMAL HIGH (ref 70–99)
Potassium: 4.1 mmol/L (ref 3.5–5.1)
Sodium: 138 mmol/L (ref 135–145)
Total Bilirubin: 1.1 mg/dL (ref 0.3–1.2)
Total Protein: 7.3 g/dL (ref 6.5–8.1)

## 2022-05-13 LAB — TSH: TSH: 5.694 u[IU]/mL — ABNORMAL HIGH (ref 0.350–4.500)

## 2022-05-13 LAB — CK: Total CK: 34 U/L — ABNORMAL LOW (ref 49–397)

## 2022-05-13 MED ORDER — PREDNISONE 10 MG PO TABS: ORAL_TABLET | ORAL | 0 refills | Status: AC

## 2022-05-13 MED ORDER — HYDROMORPHONE HCL 1 MG/ML IJ SOLN
1.0000 mg | Freq: Once | INTRAMUSCULAR | Status: AC
  Administered 2022-05-13: 1 mg via INTRAVENOUS
  Filled 2022-05-13: qty 1

## 2022-05-13 MED ORDER — HYDROMORPHONE HCL 1 MG/ML IJ SOLN: 1.0000 mg | Freq: Once | INTRAMUSCULAR | Status: DC

## 2022-05-13 MED ORDER — OXYCODONE HCL 5 MG PO TABS: 5.0000 mg | ORAL_TABLET | ORAL | 0 refills | Status: AC | PRN

## 2022-05-13 MED ORDER — METHYLPREDNISOLONE SODIUM SUCC 125 MG IJ SOLR
125.0000 mg | Freq: Once | INTRAMUSCULAR | Status: AC
  Administered 2022-05-13: 125 mg via INTRAVENOUS
  Filled 2022-05-13: qty 2

## 2022-05-13 NOTE — ED Notes (Signed)
Pt experiencing pain all over. Pt has been to dr and referred to specialist. Pain 10 out of 10

## 2022-05-13 NOTE — ED Provider Notes (Signed)
[facility]-EMERGENCY DEPT [facility] Emergency Department Provider Note MRN:  [id number]  Arrival date & time: 05/13/22     Chief Complaint   Generalized Body Aches   History of Present Illness   Russell Roach is a 51 y.o. year-old male with a history of hypertension, diabetes presenting to the ED with chief complaint of body aches.  Persistent body aches for the past 7 weeks, getting worse.  Whole body feels like he has been doing strenuous exercise but he has not.  Pain in the joints as well.  Denies rash.  Has been told he may have a rheumatologic condition, does not have follow-up with rheumatology until September.  Barely able to walk this evening, here for help with the pain.  Denies fever.  Review of Systems  A thorough review of systems was obtained and all systems are negative except as noted in the HPI and PMH.   Patient's Health History    Past Medical History:  Diagnosis Date   High cholesterol    Hypertension    Type 2 diabetes mellitus (HCC) 02/10/2021    No past surgical history on file.  No family history on file.  Social History   Socioeconomic History   Marital status: Married    Spouse name: Not on file   Number of children: Not on file   Years of education: Not on file   Highest education level: Not on file  Occupational History   Not on file  Tobacco Use   Smoking status: Never   Smokeless tobacco: Never  Substance and Sexual Activity   Alcohol use: Never   Drug use: Never   Sexual activity: Not on file  Other Topics Concern   Not on file  Social History Narrative   Not on file   Social Determinants of Health   Financial Resource Strain: Not on file  Food Insecurity: Not on file  Transportation Needs: Not on file  Physical Activity: Not on file  Stress: Not on file  Social Connections: Not on file  Intimate Partner Violence: Not on file     Physical Exam   Vitals:   05/13/22 0628 05/13/22 0629  BP: (!) 153/100   Pulse: 94   Resp:  (!) 26   Temp:  97.8 F (36.6 C)  SpO2: 97%     CONSTITUTIONAL: Well-appearing, in moderate distress due to pain NEURO/PSYCH:  Alert and oriented x 3, no focal deficits, exam limited due to pain EYES:  eyes equal and reactive ENT/NECK:  no LAD, no JVD CARDIO: Regular rate, well-perfused, normal S1 and S2 PULM:  CTAB no wheezing or rhonchi GI/GU:  non-distended, non-tender MSK/SPINE:  No gross deformities, no edema SKIN:  no rash, atraumatic   *Additional and/or pertinent findings included in MDM below  Diagnostic and Interventional Summary    EKG Interpretation  Date/Time:  Thursday May 13 2022 04:24:38 EDT Ventricular Rate:  92 PR Interval:  125 QRS Duration: 95 QT Interval:  390 QTC Calculation: 483 R Axis:   49 Text Interpretation: Sinus rhythm Borderline prolonged QT interval Confirmed by Kennis Carina [phone]) on 05/13/2022 4:46:28 AM       Labs Reviewed  CBC - Abnormal; Notable for the following components:      Result Value   WBC 14.2 (*)    All other components within normal limits  COMPREHENSIVE METABOLIC PANEL - Abnormal; Notable for the following components:   Glucose, Bld 226 (*)    AST 14 (*)  All other components within normal limits  CK - Abnormal; Notable for the following components:   Total CK 34 (*)    All other components within normal limits  TSH - Abnormal; Notable for the following components:   TSH 5.694 (*)    All other components within normal limits  LACTIC ACID, PLASMA    No orders to display    Medications  HYDROmorphone (DILAUDID) injection 1 mg (has no administration in time range)  HYDROmorphone (DILAUDID) injection 1 mg (1 mg Intravenous Given 05/13/22 0432)  methylPREDNISolone sodium succinate (SOLU-MEDROL) 125 mg/2 mL injection 125 mg (125 mg Intravenous Given 05/13/22 0433)     Procedures  /  Critical Care Procedures  ED Course and Medical Decision Making  Initial Impression and Ddx Total body pain, arthralgias,  myalgias.  Differential diagnosis includes rhabdomyolysis, rheumatologic condition, tickborne illness, electrolyte disturbance.  Awaiting labs, symptomatic control, will reassess.  Past medical/surgical history that increases complexity of ED encounter: None  Interpretation of Diagnostics I personally reviewed the laboratory assessment and my interpretation is as follows: No significant blood count or electrolyte disturbance, minimally elevated TSH, minimal leukocytosis    Patient Reassessment and Ultimate Disposition/Management     Patient is much more comfortable on reassessment.  He does not have an isolated joint that is concerning for septic joint.  Polyarthritis, suspect rheumatologic condition.  Given the better controlled pain there is no indication for further testing or admission here in the emergency department, advised to keep his follow-up with rheumatology.  Return precautions.  Patient management required discussion with the following services or consulting groups:  None  Complexity of Problems Addressed Acute illness or injury that poses threat of life of bodily function  Additional Data Reviewed and Analyzed Further history obtained from: Further history from spouse/family member  Additional Factors Impacting ED Encounter Risk Prescriptions  Elmer Sow. Pilar Plate, MD [facility] Emergency Medicine [facility] [email or web address]  Final Clinical Impressions(s) / ED Diagnoses     ICD-10-CM   1. Polyarthritis  M13.0       ED Discharge Orders          Ordered    predniSONE (DELTASONE) 10 MG tablet        05/13/22 0628    oxyCODONE (ROXICODONE) 5 MG immediate release tablet  Every 4 hours PRN        05/13/22 0628             Discharge Instructions Discussed with and Provided to Patient:     Discharge Instructions      You were evaluated in the Emergency Department and after careful evaluation, we did not find any emergent condition  requiring admission or further testing in the hospital.  Your symptoms seem to be due to a rheumatologic condition.  Recommend taking the steroids as directed, using the oxycodone for pain.  Recommend Tylenol 1000 mg every 4-6 hours.  Keep your rheumatology appointment.  Please return to the Emergency Department if you experience any worsening of your condition.   Thank you for allowing [place] to be a part of your care.       Sabas Sous, MD 05/13/22 [phone]

## 2022-05-13 NOTE — ED Triage Notes (Signed)
Pt has Pain all over and was seen here for same last week.  He was referred to a Rheumatologist but cannot get in until the 5th of September.  He states he has too much pain to wait until then.

## 2022-05-13 NOTE — Discharge Instructions (Signed)
You were evaluated in the Emergency Department and after careful evaluation, we did not find any emergent condition requiring admission or further testing in the hospital.  Your symptoms seem to be due to a rheumatologic condition.  Recommend taking the steroids as directed, using the oxycodone for pain.  Recommend Tylenol 1000 mg every 4-6 hours.  Keep your rheumatology appointment.  Please return to the Emergency Department if you experience any worsening of your condition.   Thank you for allowing [place] to be a part of your care.
